# Patient Record
Sex: Female | Born: 1980 | Race: Black or African American | Hispanic: No | Marital: Single | Smoking: Former smoker
Health system: Southern US, Community
[De-identification: ages and names within clinical notes are randomized; demographics above are authoritative.]

## PROBLEM LIST (undated history)

## (undated) ENCOUNTER — Ambulatory Visit (HOSPITAL_COMMUNITY): Admission: EM | Payer: 59 | Source: Home / Self Care

## (undated) ENCOUNTER — Ambulatory Visit: Admission: EM | Payer: 59

## (undated) DIAGNOSIS — K219 Gastro-esophageal reflux disease without esophagitis: Secondary | ICD-10-CM

## (undated) DIAGNOSIS — Z5189 Encounter for other specified aftercare: Secondary | ICD-10-CM

## (undated) DIAGNOSIS — E559 Vitamin D deficiency, unspecified: Secondary | ICD-10-CM

## (undated) DIAGNOSIS — D649 Anemia, unspecified: Secondary | ICD-10-CM

## (undated) DIAGNOSIS — R51 Headache: Secondary | ICD-10-CM

## (undated) DIAGNOSIS — F329 Major depressive disorder, single episode, unspecified: Secondary | ICD-10-CM

## (undated) DIAGNOSIS — F419 Anxiety disorder, unspecified: Secondary | ICD-10-CM

## (undated) DIAGNOSIS — T7840XA Allergy, unspecified, initial encounter: Secondary | ICD-10-CM

## (undated) DIAGNOSIS — J45909 Unspecified asthma, uncomplicated: Secondary | ICD-10-CM

## (undated) DIAGNOSIS — F32A Depression, unspecified: Secondary | ICD-10-CM

## (undated) DIAGNOSIS — Z9889 Other specified postprocedural states: Secondary | ICD-10-CM

## (undated) DIAGNOSIS — D259 Leiomyoma of uterus, unspecified: Secondary | ICD-10-CM

## (undated) HISTORY — DX: Other specified postprocedural states: Z98.890

## (undated) HISTORY — DX: Encounter for other specified aftercare: Z51.89

## (undated) HISTORY — DX: Vitamin D deficiency, unspecified: E55.9

## (undated) HISTORY — DX: Major depressive disorder, single episode, unspecified: F32.9

## (undated) HISTORY — DX: Allergy, unspecified, initial encounter: T78.40XA

## (undated) HISTORY — DX: Headache: R51

## (undated) HISTORY — DX: Unspecified asthma, uncomplicated: J45.909

## (undated) HISTORY — DX: Depression, unspecified: F32.A

## (undated) HISTORY — DX: Gastro-esophageal reflux disease without esophagitis: K21.9

## (undated) HISTORY — DX: Anxiety disorder, unspecified: F41.9

## (undated) HISTORY — PX: WISDOM TOOTH EXTRACTION: SHX21

---

## 1999-08-11 ENCOUNTER — Encounter: Payer: Self-pay | Admitting: Emergency Medicine

## 1999-08-11 ENCOUNTER — Emergency Department (HOSPITAL_COMMUNITY): Admission: EM | Admit: 1999-08-11 | Discharge: 1999-08-11 | Payer: Self-pay | Admitting: *Deleted

## 1999-08-26 ENCOUNTER — Other Ambulatory Visit: Admission: RE | Admit: 1999-08-26 | Discharge: 1999-08-26 | Payer: Self-pay | Admitting: Obstetrics and Gynecology

## 1999-10-10 ENCOUNTER — Other Ambulatory Visit: Admission: RE | Admit: 1999-10-10 | Discharge: 1999-10-10 | Payer: Self-pay | Admitting: Obstetrics and Gynecology

## 1999-10-10 ENCOUNTER — Encounter (INDEPENDENT_AMBULATORY_CARE_PROVIDER_SITE_OTHER): Payer: Self-pay

## 2001-12-11 ENCOUNTER — Emergency Department (HOSPITAL_COMMUNITY): Admission: EM | Admit: 2001-12-11 | Discharge: 2001-12-12 | Payer: Self-pay | Admitting: Emergency Medicine

## 2007-05-11 ENCOUNTER — Emergency Department (HOSPITAL_COMMUNITY): Admission: EM | Admit: 2007-05-11 | Discharge: 2007-05-11 | Payer: Self-pay | Admitting: Emergency Medicine

## 2009-11-18 ENCOUNTER — Ambulatory Visit: Payer: Self-pay | Admitting: Internal Medicine

## 2009-11-18 ENCOUNTER — Encounter (INDEPENDENT_AMBULATORY_CARE_PROVIDER_SITE_OTHER): Payer: Self-pay | Admitting: Family Medicine

## 2009-11-18 LAB — CONVERTED CEMR LAB
ALT: 9 units/L (ref 0–35)
AST: 18 units/L (ref 0–37)
Albumin: 4.4 g/dL (ref 3.5–5.2)
Basophils Absolute: 0 10*3/uL (ref 0.0–0.1)
CO2: 20 meq/L (ref 19–32)
Cholesterol: 136 mg/dL (ref 0–200)
Eosinophils Relative: 6 % — ABNORMAL HIGH (ref 0–5)
Glucose, Bld: 87 mg/dL (ref 70–99)
LDL Cholesterol: 66 mg/dL (ref 0–99)
Lymphocytes Relative: 19 % (ref 12–46)
Lymphs Abs: 2.2 10*3/uL (ref 0.7–4.0)
MCV: 86 fL (ref 78.0–100.0)
Neutro Abs: 7.9 10*3/uL — ABNORMAL HIGH (ref 1.7–7.7)
Neutrophils Relative %: 72 % (ref 43–77)
Platelets: 335 10*3/uL (ref 150–400)
Potassium: 4.3 meq/L (ref 3.5–5.3)
Prolactin: 7.7 ng/mL
RBC: 4.87 M/uL (ref 3.87–5.11)
RDW: 15.8 % — ABNORMAL HIGH (ref 11.5–15.5)
Sodium: 136 meq/L (ref 135–145)
Testosterone: 58.02 ng/dL (ref 10–70)
Total Bilirubin: 0.4 mg/dL (ref 0.3–1.2)
Total CHOL/HDL Ratio: 2.4
Total Protein: 7.7 g/dL (ref 6.0–8.3)
VLDL: 14 mg/dL (ref 0–40)
WBC: 11.1 10*3/uL — ABNORMAL HIGH (ref 4.0–10.5)

## 2009-11-22 ENCOUNTER — Ambulatory Visit (HOSPITAL_COMMUNITY): Admission: RE | Admit: 2009-11-22 | Discharge: 2009-11-22 | Payer: Self-pay | Admitting: Family Medicine

## 2009-11-27 ENCOUNTER — Ambulatory Visit (HOSPITAL_COMMUNITY): Admission: RE | Admit: 2009-11-27 | Discharge: 2009-11-27 | Payer: Self-pay | Admitting: Family Medicine

## 2011-06-18 ENCOUNTER — Ambulatory Visit: Payer: Self-pay

## 2012-01-13 ENCOUNTER — Ambulatory Visit (INDEPENDENT_AMBULATORY_CARE_PROVIDER_SITE_OTHER): Payer: 59 | Admitting: Emergency Medicine

## 2012-01-13 VITALS — BP 118/74 | HR 71 | Temp 97.5°F | Resp 18 | Ht 66.0 in | Wt 168.0 lb

## 2012-01-13 DIAGNOSIS — J329 Chronic sinusitis, unspecified: Secondary | ICD-10-CM

## 2012-01-13 DIAGNOSIS — J45901 Unspecified asthma with (acute) exacerbation: Secondary | ICD-10-CM

## 2012-01-13 MED ORDER — AMOXICILLIN-POT CLAVULANATE 875-125 MG PO TABS: 1.0000 | ORAL_TABLET | Freq: Two times a day (BID) | ORAL | Status: DC

## 2012-01-13 MED ORDER — PSEUDOEPHEDRINE-GUAIFENESIN ER 60-600 MG PO TB12: 1.0000 | ORAL_TABLET | Freq: Two times a day (BID) | ORAL | Status: DC

## 2012-01-13 MED ORDER — ALBUTEROL SULFATE HFA 108 (90 BASE) MCG/ACT IN AERS: 2.0000 | INHALATION_SPRAY | RESPIRATORY_TRACT | Status: DC | PRN

## 2012-01-13 MED ORDER — MOMETASONE FUROATE 50 MCG/ACT NA SUSP: 2.0000 | Freq: Every day | NASAL | Status: DC

## 2012-01-13 NOTE — Progress Notes (Signed)
[facility] [address], [place] [address] [phone]  Date:  01/13/2012   Name:  Alicia Wells   DOB:  [birth year]   MRN:  [id number]  PCP:  No primary provider on file.    Chief Complaint: Sinusitis and Asthma   History of Present Illness:  Alicia Wells is a 31 y.o. very pleasant female patient who presents with the following:  Marked nasal congestion for past three weeks with no nasal discharge has some post nasal drip but not seen.  Has headache and pressure in frontal and maxillary sinuses.  No fever or chills.  No sore throat.  Cough that is largely non productive and worse at night. No improvement with OTC medications.  Some exacerbation of asthma and using her aunt's MDI with some improvement.  There is no problem list on file for this patient.   Past Medical History  Diagnosis Date  . Allergy   . Blood transfusion without reported diagnosis   . Depression   . Anxiety     No past surgical history on file.  History  Substance Use Topics  . Smoking status: Current Every Day Smoker -- 1.0 packs/day    Types: Cigarettes    Start date: 01/12/2002  . Smokeless tobacco: Not on file  . Alcohol Use: Not on file    No family history on file.  Allergies  Allergen Reactions  . Penicillins     Medication list has been reviewed and updated.  No current outpatient prescriptions on file prior to visit.    Review of Systems:  As per HPI, otherwise negative.    Physical Examination: Filed Vitals:   01/13/12 1207  BP: 118/74  Pulse: 71  Temp: 97.5 F (36.4 C)  Resp: 18   Filed Vitals:   01/13/12 1207  Height: 5\' 6"  (1.676 m)  Weight: 168 lb (76.204 kg)   Body mass index is 27.12 kg/(m^2). Ideal Body Weight: Weight in (lb) to have BMI = 25: 154.6   GEN: WDWN, NAD, Non-toxic, A & O x 3  No rash or sepsis. No shortness of breath HEENT: Atraumatic, Normocephalic. Neck supple. No masses, No LAD.  Oropharynx  negative Ears and Nose: No external deformity. TM negative.  Nasal mucosa erythematous with posterior pharyngeal wall purulent drainage CV: RRR, No M/G/R. No JVD. No thrill. No extra heart sounds. PULM: CTA B, no wheezes, crackles, rhonchi. No retractions. No resp. distress. No accessory muscle use. ABD: S, NT, ND, +BS. No rebound. No HSM. EXTR: No c/c/e NEURO Normal gait.  PSYCH: Normally interactive. Conversant. Not depressed or anxious appearing.  Calm demeanor.    Assessment and Plan: Sinusitis Exacerbation asthma Augment Albuterol mucinex d nasonex  Carmelina Dane, MD  I have reviewed and agree with documentation. Robert P. Merla Riches, M.D.

## 2012-01-15 ENCOUNTER — Telehealth: Payer: Self-pay

## 2012-01-15 MED ORDER — CLARITHROMYCIN ER 500 MG PO TB24: 1000.0000 mg | ORAL_TABLET | Freq: Every day | ORAL | Status: DC

## 2012-01-15 NOTE — Telephone Encounter (Signed)
D/C the Augmentin.  Biaxin XL 500 mg 2 PO QD x 10 days, #20, no refills sent to the pharmacy.  Please advise her to take it will food-a meal, not just a snack-as it can cause GI upset as well.  Also, she can expect a metallic taste as a side effect of this product.  (Of, note, the patient lists Penicillins as an allergy, and in the encounter note, though no reaction is documented.)

## 2012-01-15 NOTE — Telephone Encounter (Signed)
Being treated for sinusitis, states Amox makes her nauseated, please advise if we can change.

## 2012-01-15 NOTE — Telephone Encounter (Signed)
Amoxicillin is making patient nauseated and she cannot keep food down, would like different rx or something for nausea.  Best [phone]

## 2012-01-15 NOTE — Telephone Encounter (Signed)
patient notified and voiced understanding. Called pharmacy and left RX on voice mail.

## 2012-06-29 ENCOUNTER — Ambulatory Visit: Payer: 59

## 2012-07-29 ENCOUNTER — Encounter: Payer: Self-pay | Admitting: Family Medicine

## 2012-07-29 ENCOUNTER — Ambulatory Visit (INDEPENDENT_AMBULATORY_CARE_PROVIDER_SITE_OTHER): Payer: 59 | Admitting: Family Medicine

## 2012-07-29 VITALS — BP 110/70 | HR 72 | Temp 99.0°F | Resp 12 | Ht 65.5 in | Wt 162.0 lb

## 2012-07-29 DIAGNOSIS — Z8659 Personal history of other mental and behavioral disorders: Secondary | ICD-10-CM

## 2012-07-29 DIAGNOSIS — J45909 Unspecified asthma, uncomplicated: Secondary | ICD-10-CM

## 2012-07-29 DIAGNOSIS — J029 Acute pharyngitis, unspecified: Secondary | ICD-10-CM

## 2012-07-29 DIAGNOSIS — G43909 Migraine, unspecified, not intractable, without status migrainosus: Secondary | ICD-10-CM

## 2012-07-29 DIAGNOSIS — F17200 Nicotine dependence, unspecified, uncomplicated: Secondary | ICD-10-CM

## 2012-07-29 DIAGNOSIS — J4520 Mild intermittent asthma, uncomplicated: Secondary | ICD-10-CM

## 2012-07-29 NOTE — Progress Notes (Signed)
  Subjective:    Patient ID: Alicia Wells, female    DOB: 12/09/1980, 32 y.o.   MRN: [id number]  HPI Patient seen to establish care. History of reported mild intermittent asthma. No flareups in several years. Currently takes no regular medications.  She is here with reported one month history of sore throat left side of throat. No known foreign bodies. No lymphadenopathy. No fever. No appetite or weight changes. Denies any GERD symptoms.  Smoker. Symptoms are relatively mild. Occasional improvement with ibuprofen. No exacerbating features.  Reported history of migraine headaches though none in quite some time. Also past history of depression which is been stable off medication for 2 years. No prior surgeries. No siblings. Parents are alive and in good health. Father may have type 2 diabetes  Patient is single. Works at [employer].  Past Medical History  Diagnosis Date  . Allergy   . Blood transfusion without reported diagnosis   . Depression   . Anxiety   . Headache    History reviewed. No pertinent past surgical history.  reports that she has been smoking Cigarettes.  She started smoking about 10 years ago. She has been smoking about 1.00 pack per day. She does not have any smokeless tobacco history on file. She reports that she does not use illicit drugs. Her alcohol history is not on file. family history includes Diabetes in her father. Allergies  Allergen Reactions  . Penicillins         Review of Systems  Constitutional: Negative for fever, chills, appetite change, fatigue and unexpected weight change.  HENT: Positive for sore throat. Negative for ear pain, congestion, trouble swallowing and voice change.   Respiratory: Negative for cough, shortness of breath and wheezing.   Cardiovascular: Negative for chest pain.  Neurological: Negative for headaches.  Hematological: Negative for adenopathy.       Objective:   Physical Exam  Constitutional: She is  oriented to person, place, and time. She appears well-developed and well-nourished.  HENT:  Right Ear: External ear normal.  Left Ear: External ear normal.  Mouth/Throat: Oropharynx is clear and moist.  Neck: Neck supple. No thyromegaly present.  Cardiovascular: Normal rate and regular rhythm.   No murmur heard. Pulmonary/Chest: Effort normal and breath sounds normal. No respiratory distress. She has no wheezes. She has no rales.  Musculoskeletal: She exhibits no edema.  Lymphadenopathy:    She has no cervical adenopathy.  Neurological: She is alert and oriented to person, place, and time.  Psychiatric: She has a normal mood and affect. Her behavior is normal.          Assessment & Plan:  #1 sore throat. Nonfocal exam. No evidence for a tonsillar masses or oropharyngeal masses. No lymphadenopathy. No exudate or erythema. We discussed possible referral to ENT for further evaluation if symptoms persist. She will try some saltwater gargles first and be in touch one week if no improvement #2 history of migraine headaches. Stable #3 ongoing nicotine use. Current motivation fairly low #4 past history depression currently stable

## 2012-07-29 NOTE — Patient Instructions (Addendum)
Try some salt water gargles. Let me know if throat no better in 2-3 weeks. Consider complete physical at some point later this year.

## 2012-10-31 DIAGNOSIS — Z0271 Encounter for disability determination: Secondary | ICD-10-CM

## 2012-11-28 ENCOUNTER — Encounter: Payer: 59 | Admitting: Family Medicine

## 2012-12-19 ENCOUNTER — Ambulatory Visit (INDEPENDENT_AMBULATORY_CARE_PROVIDER_SITE_OTHER): Payer: 59 | Admitting: Family Medicine

## 2012-12-19 ENCOUNTER — Encounter: Payer: Self-pay | Admitting: Family Medicine

## 2012-12-19 ENCOUNTER — Other Ambulatory Visit (HOSPITAL_COMMUNITY)
Admission: RE | Admit: 2012-12-19 | Discharge: 2012-12-19 | Disposition: A | Discharge status: Home / Self Care | Payer: 59 | Source: Ambulatory Visit | Attending: Family Medicine | Admitting: Family Medicine

## 2012-12-19 VITALS — BP 114/72 | HR 89 | Temp 98.1°F | Wt 164.0 lb

## 2012-12-19 DIAGNOSIS — F17200 Nicotine dependence, unspecified, uncomplicated: Secondary | ICD-10-CM

## 2012-12-19 DIAGNOSIS — Z0000 Encounter for general adult medical examination without abnormal findings: Secondary | ICD-10-CM

## 2012-12-19 DIAGNOSIS — Z01419 Encounter for gynecological examination (general) (routine) without abnormal findings: Secondary | ICD-10-CM | POA: Insufficient documentation

## 2012-12-19 LAB — CBC WITH DIFFERENTIAL/PLATELET
Basophils Relative: 0.4 % (ref 0.0–3.0)
Eosinophils Relative: 5.2 % — ABNORMAL HIGH (ref 0.0–5.0)
HCT: 37.5 % (ref 36.0–46.0)
Lymphs Abs: 2.9 10*3/uL (ref 0.7–4.0)
MCV: 85.4 fl (ref 78.0–100.0)
Monocytes Absolute: 0.7 10*3/uL (ref 0.1–1.0)
Neutro Abs: 7.7 10*3/uL (ref 1.4–7.7)
RBC: 4.39 Mil/uL (ref 3.87–5.11)
WBC: 11.9 10*3/uL — ABNORMAL HIGH (ref 4.5–10.5)

## 2012-12-19 LAB — LIPID PANEL: Cholesterol: 133 mg/dL (ref 0–200)

## 2012-12-19 LAB — BASIC METABOLIC PANEL
BUN: 9 mg/dL (ref 6–23)
Calcium: 9.8 mg/dL (ref 8.4–10.5)
GFR: 98.42 mL/min (ref >60.00)
Glucose, Bld: 92 mg/dL (ref 70–99)
Sodium: 139 mEq/L (ref 135–145)

## 2012-12-19 LAB — POCT URINALYSIS DIPSTICK
Ketones, UA: NEGATIVE
Leukocytes, UA: NEGATIVE
Nitrite, UA: NEGATIVE
Protein, UA: NEGATIVE
pH, UA: 7

## 2012-12-19 LAB — HEPATIC FUNCTION PANEL
ALT: 19 U/L (ref 0–35)
AST: 28 U/L (ref 0–37)
Albumin: 3.9 g/dL (ref 3.5–5.2)
Total Protein: 7.6 g/dL (ref 6.0–8.3)

## 2012-12-19 MED ORDER — BECLOMETHASONE DIPROPIONATE 80 MCG/ACT IN AERS: 2.0000 | INHALATION_SPRAY | Freq: Two times a day (BID) | RESPIRATORY_TRACT | Status: DC

## 2012-12-19 NOTE — Progress Notes (Signed)
  Subjective:    Patient ID: Alicia Wells, female    DOB: 02-08-1981, 32 y.o.   MRN: [id number]  HPI Patient seen for complete physical. She has ongoing nicotine use and currently only smokes about 6 cigarettes per day. She is hoping to quit soon on her own. She has history of reported mild intermittent asthma. However, recently she has been wheezing almost nightly. She uses albuterol as needed. She does not take any controller medications. She has not had a Pap smear estimated in 2 years. She denies prior history of abnormal Pap smear. Currently not sexually active. No history of HPV  She had tetanus 2012. Declines flu vaccine.  Past Medical History  Diagnosis Date  . Allergy   . Blood transfusion without reported diagnosis   . Depression   . Anxiety   . Headache(784.0)    No past surgical history on file.  reports that she has been smoking Cigarettes.  She started smoking about 10 years ago. She has been smoking about 1.00 pack per day. She does not have any smokeless tobacco history on file. She reports that she does not use illicit drugs. Her alcohol history is not on file. family history includes Diabetes in her father. Allergies  Allergen Reactions  . Penicillins       Review of Systems  Constitutional: Negative for fever, activity change, appetite change, fatigue and unexpected weight change.  HENT: Negative for hearing loss, ear pain, sore throat and trouble swallowing.   Eyes: Negative for visual disturbance.  Respiratory: Negative for cough and shortness of breath.   Cardiovascular: Negative for chest pain and palpitations.  Gastrointestinal: Negative for abdominal pain, diarrhea, constipation and blood in stool.  Genitourinary: Negative for dysuria and hematuria.  Musculoskeletal: Negative for myalgias, back pain and arthralgias.  Skin: Negative for rash.  Neurological: Negative for dizziness, syncope and headaches.  Hematological: Negative for adenopathy.   Psychiatric/Behavioral: Negative for confusion and dysphoric mood.       Objective:   Physical Exam  Constitutional: She is oriented to person, place, and time. She appears well-developed and well-nourished.  HENT:  Head: Normocephalic and atraumatic.  Eyes: EOM are normal. Pupils are equal, round, and reactive to light.  Neck: Normal range of motion. Neck supple. No thyromegaly present.  Cardiovascular: Normal rate, regular rhythm and normal heart sounds.   No murmur heard. Pulmonary/Chest: Breath sounds normal. No respiratory distress. She has no wheezes. She has no rales.  Abdominal: Soft. Bowel sounds are normal. She exhibits no distension and no mass. There is no tenderness. There is no rebound and no guarding.  Genitourinary:  Breasts are symmetric with no mass. Pelvic exam normal external genitalia. Vaginal mucosa normal in appearance. Pap smear obtained. Uterus normal size. No adnexal masses.  Musculoskeletal: Normal range of motion. She exhibits no edema.  Lymphadenopathy:    She has no cervical adenopathy.  Neurological: She is alert and oriented to person, place, and time. She displays normal reflexes. No cranial nerve deficit.  Skin: No rash noted.  Psychiatric: She has a normal mood and affect. Her behavior is normal. Judgment and thought content normal.          Assessment & Plan:  Complete physical. Screening lab work obtained. Pap smear obtained. Add steroid inhaler to help control asthma and reassess one month. Smoking cessation discussed. Her motivation is fair. Flu vaccine recommended and declined

## 2012-12-19 NOTE — Patient Instructions (Addendum)
Smoking Cessation Quitting smoking is important to your health and has many advantages. However, it is not always easy to quit since nicotine is a very addictive drug. Often times, people try 3 times or more before being able to quit. This document explains the best ways for you to prepare to quit smoking. Quitting takes hard work and a lot of effort, but you can do it. ADVANTAGES OF QUITTING SMOKING  You will live longer, feel better, and live better.  Your body will feel the impact of quitting smoking almost immediately.  Within 20 minutes, blood pressure decreases. Your pulse returns to its normal level.  After 8 hours, carbon monoxide levels in the blood return to normal. Your oxygen level increases.  After 24 hours, the chance of having a heart attack starts to decrease. Your breath, hair, and body stop smelling like smoke.  After 48 hours, damaged nerve endings begin to recover. Your sense of taste and smell improve.  After 72 hours, the body is virtually free of nicotine. Your bronchial tubes relax and breathing becomes easier.  After 2 to 12 weeks, lungs can hold more air. Exercise becomes easier and circulation improves.  The risk of having a heart attack, stroke, cancer, or lung disease is greatly reduced.  After 1 year, the risk of coronary heart disease is cut in half.  After 5 years, the risk of stroke falls to the same as a nonsmoker.  After 10 years, the risk of lung cancer is cut in half and the risk of other cancers decreases significantly.  After 15 years, the risk of coronary heart disease drops, usually to the level of a nonsmoker.  If you are pregnant, quitting smoking will improve your chances of having a healthy baby.  The people you live with, especially any children, will be healthier.  You will have extra money to spend on things other than cigarettes. QUESTIONS TO THINK ABOUT BEFORE ATTEMPTING TO QUIT You may want to talk about your answers with your  caregiver.  Why do you want to quit?  If you tried to quit in the past, what helped and what did not?  What will be the most difficult situations for you after you quit? How will you plan to handle them?  Who can help you through the tough times? Your family? Friends? A caregiver?  What pleasures do you get from smoking? What ways can you still get pleasure if you quit? Here are some questions to ask your caregiver:  How can you help me to be successful at quitting?  What medicine do you think would be best for me and how should I take it?  What should I do if I need more help?  What is smoking withdrawal like? How can I get information on withdrawal? GET READY  Set a quit date.  Change your environment by getting rid of all cigarettes, ashtrays, matches, and lighters in your home, car, or work. Do not let people smoke in your home.  Review your past attempts to quit. Think about what worked and what did not. GET SUPPORT AND ENCOURAGEMENT You have a better chance of being successful if you have help. You can get support in many ways.  Tell your family, friends, and co-workers that you are going to quit and need their support. Ask them not to smoke around you.  Get individual, group, or telephone counseling and support. Programs are available at local hospitals and health centers. Call your local health department for   information about programs in your area.  Spiritual beliefs and practices may help some smokers quit.  Download a "quit meter" on your computer to keep track of quit statistics, such as how long you have gone without smoking, cigarettes not smoked, and money saved.  Get a self-help book about quitting smoking and staying off of tobacco. LEARN NEW SKILLS AND BEHAVIORS  Distract yourself from urges to smoke. Talk to someone, go for a walk, or occupy your time with a task.  Change your normal routine. Take a different route to work. Drink tea instead of coffee.  Eat breakfast in a different place.  Reduce your stress. Take a hot bath, exercise, or read a book.  Plan something enjoyable to do every day. Reward yourself for not smoking.  Explore interactive web-based programs that specialize in helping you quit. GET MEDICINE AND USE IT CORRECTLY Medicines can help you stop smoking and decrease the urge to smoke. Combining medicine with the above behavioral methods and support can greatly increase your chances of successfully quitting smoking.  Nicotine replacement therapy helps deliver nicotine to your body without the negative effects and risks of smoking. Nicotine replacement therapy includes nicotine gum, lozenges, inhalers, nasal sprays, and skin patches. Some may be available over-the-counter and others require a prescription.  Antidepressant medicine helps people abstain from smoking, but how this works is unknown. This medicine is available by prescription.  Nicotinic receptor partial agonist medicine simulates the effect of nicotine in your brain. This medicine is available by prescription. Ask your caregiver for advice about which medicines to use and how to use them based on your health history. Your caregiver will tell you what side effects to look out for if you choose to be on a medicine or therapy. Carefully read the information on the package. Do not use any other product containing nicotine while using a nicotine replacement product.  RELAPSE OR DIFFICULT SITUATIONS Most relapses occur within the first 3 months after quitting. Do not be discouraged if you start smoking again. Remember, most people try several times before finally quitting. You may have symptoms of withdrawal because your body is used to nicotine. You may crave cigarettes, be irritable, feel very hungry, cough often, get headaches, or have difficulty concentrating. The withdrawal symptoms are only temporary. They are strongest when you first quit, but they will go away within  10 14 days. To reduce the chances of relapse, try to:  Avoid drinking alcohol. Drinking lowers your chances of successfully quitting.  Reduce the amount of caffeine you consume. Once you quit smoking, the amount of caffeine in your body increases and can give you symptoms, such as a rapid heartbeat, sweating, and anxiety.  Avoid smokers because they can make you want to smoke.  Do not let weight gain distract you. Many smokers will gain weight when they quit, usually less than 10 pounds. Eat a healthy diet and stay active. You can always lose the weight gained after you quit.  Find ways to improve your mood other than smoking. FOR MORE INFORMATION  www.smokefree.gov  Document Released: 03/03/2001 Document Revised: 09/08/2011 Document Reviewed: 06/18/2011 [employer] Patient Information 2014 [employer].  

## 2012-12-22 ENCOUNTER — Telehealth: Payer: Self-pay | Admitting: Family Medicine

## 2012-12-22 NOTE — Telephone Encounter (Signed)
Pt informed

## 2012-12-22 NOTE — Telephone Encounter (Signed)
Patient aware of her results. Patient stated the Qvar is not helping her breathing and she actually to take her Proair afterwards

## 2012-12-22 NOTE — Telephone Encounter (Signed)
Has not had time yet for Qvar.  Please emphasize to her that Qvar takes some time to become effective.

## 2012-12-22 NOTE — Telephone Encounter (Signed)
Pt returned your call for results of labs. pls call

## 2013-01-23 ENCOUNTER — Encounter: Payer: Self-pay | Admitting: Family Medicine

## 2013-01-23 ENCOUNTER — Ambulatory Visit (INDEPENDENT_AMBULATORY_CARE_PROVIDER_SITE_OTHER): Payer: 59 | Admitting: Family Medicine

## 2013-01-23 VITALS — BP 110/68 | HR 82 | Temp 98.3°F | Wt 164.0 lb

## 2013-01-23 DIAGNOSIS — J4540 Moderate persistent asthma, uncomplicated: Secondary | ICD-10-CM

## 2013-01-23 DIAGNOSIS — J45909 Unspecified asthma, uncomplicated: Secondary | ICD-10-CM

## 2013-01-23 DIAGNOSIS — J4520 Mild intermittent asthma, uncomplicated: Secondary | ICD-10-CM | POA: Insufficient documentation

## 2013-01-23 MED ORDER — VARENICLINE TARTRATE 0.5 MG X 11 & 1 MG X 42 PO MISC: ORAL | Status: DC

## 2013-01-23 MED ORDER — VARENICLINE TARTRATE 1 MG PO TABS: 1.0000 mg | ORAL_TABLET | Freq: Two times a day (BID) | ORAL | Status: DC

## 2013-01-23 NOTE — Patient Instructions (Signed)
Asthma, Adult Asthma is a condition that affects your lungs. It is characterized by swelling and narrowing of your airways as well as increased mucus production. The narrowing comes from swelling and muscle spasms inside the airways. When this happens, breathing can be difficult and you can have coughing, wheezing, and shortness of breath. Knowing more about asthma can help you manage it better. Asthma cannot be cured, but medicines and lifestyle changes can help control it. Asthma can be a minor problem for some people but if it is not controlled it can lead to a life-threatening asthma attack. Asthma can change over time. It is important to work with your caregiver to manage your asthma symptoms. CAUSES The exact cause of asthma is unknown. Asthma is believed to be caused by inherited (genetic) and environmental exposures. Swelling and redness (inflammation) of the airways occurs in asthma. This can be triggered by allergies, viral lung infections, or irritants in the air. Allergic reactions can cause you to wheeze immediately or several hours after an exposure. Asthma triggers are different for each person. It is important to pay attention and know what triggers your asthma.  Common triggers for asthma attacks include:  Animal dander from the skin, hair, or feathers of animals.  Dust mites contained in house dust.  Cockroaches.  Pollen from trees or grass.  Mold.  Cigarette or tobacco smoke. Smoking cannot be allowed in homes of people with asthma. People with asthma should not smoke and should not be around smokers.  Air pollutants such as dust, household cleaners, hair sprays, aerosol sprays, paint fumes, strong chemicals, or strong odors.  Cold air or weather changes. Cold air may cause inflammation. Winds increase molds and pollens in the air. There is not one best climate for people with asthma.  Strong emotions such as crying or laughing hard.  Stress.  Certain medicines such as  aspirin or beta-blockers.  Sulfites in such foods and drinks as dried fruits and wine.  Infections or inflammatory conditions such as the flu, a cold, or an inflammation of the nasal membranes (rhinitis).  Gastroesophageal reflux disease (GERD). GERD is a condition where stomach acid backs up into your throat (esophagus).  Exercise or strenous activity. Proper pre-exercise medicines allow most people to participate in sports. SYMPTOMS  Feeling short of breath.  Chest tightness or pain.  Difficulty sleeping due to coughing, wheezing, or feeling short of breath.  A whistling or wheezing sound with exhalation.  Coughing or wheezing that is worse when you:  Have a virus (such as a cold or the flu).  Are suffering from allergies.  Are exposed to certain fumes or chemicals.  Exercise. Signs that your asthma is probably getting worse include:   More frequent and bothersome asthma signs and symptoms.  Increasing difficulty breathing. This can be measured by a peak flow meter, which is a simple device used to check how well your lungs are working.  An increasingly frequent need to use a quick-relief inhaler. DIAGNOSIS  The diagnosis of asthma is made by review of your medical history, a physical exam, and possibly from other tests. Lung function studies may help with the diagnosis. TREATMENT  Asthma cannot be cured. However, for the majority of adults, asthma can be controlled with treatment. Besides avoidance of triggers of your asthma, medicines are often required. There are 2 classes of medicine used for asthma treatment: controller medicines (reduce inflammation and symptoms) andreliever or rescue medicines (relieve asthma symptoms during acute attacks). You may require daily   medicines to control your asthma. The most effective long-term controller medicines for asthma are inhaled corticosteroids (blocks inflammation). Other long-term control medicines include:  Leukotriene  receptor antagonists (blocks a pathway of inflammation).  Long-acting beta2-agonists (relaxes the muscles of the airways for at least 12 hours) with an inhaled corticosteroid.  Cromolyn sodium or nedocromil (alters certain inflammatory cells' ability to release chemicals that cause inflammation).  Immunomodulators (alters the immune system to prevent asthma symptoms).  Theophylline (relaxes muscles in the airways). You may also require a short-acting beta2-agonist to relieve asthma symptoms during an acute attack. You should understand what to do during an acute attack. Inhaled medicines are effective when used properly. Read the instructions on how to use your medicines correctly and speak to your caregiver if you have questions. Follow up with your caregiver on a regular basis to make sure your asthma is well-controlled. If your asthma is not well-controlled, if you have been hospitalized for asthma, or if multiple medicines or medium to high doses of inhaled corticosteroids are needed to control your asthma, request a referral to an asthma specialist. HOME CARE INSTRUCTIONS   Take medicines as directed by your caregiver.  Control your home environment in the following ways to help prevent asthma attacks:  Change your heating and air conditioning filter at least once a month.  Place a filter or cheesecloth over your heating and air conditioning vents.  Limit the use of fireplaces and wood stoves.  Do not smoke. Do not stay in places where others are smoking.  Get rid of pests (such as roaches and mice) and their droppings.  If you see mold on a plant, throw it away.  Clean your floors and dust every week. Use unscented cleaning products. Use a vacuum cleaner with a HEPA filter if possible. If vacuuming or cleaning triggers your asthma, try to find someone else to do these chores.  Floors in your house should be wood, tile, or vinyl. Carpet can trap dander and dust.  Use  allergy-proof pillows, mattress covers, and box spring covers.  Wash bedsheets and blankets every week in hot water and dry in a dryer.  Use a blanket that is made of polyester or cotton with a tight nap.  Do not use a dust ruffle on your bed.  Clean bathrooms and kitchens with bleach and repaint with mold-resistant paint.  Wash hands frequently.  Talk to your caregiver about an action plan for managing asthma attacks. This includes the use of a peak flow meter which measures the severity of the attack and medicines that can help stop the attack. An action plan can help minimize or stop the attack without having to seek medical care.  Remain calm during an asthma attack.  Always have a plan prepared for seeking medical attention. This should include contacting your caregiver and in the case of a severe attack, calling your local emergency services (911 in [place] SEEK MEDICAL CARE IF:   You have wheezing, shortness of breath, or a cough even if taking medicine to prevent attacks.  You have thickening of sputum.  Your sputum changes from clear or white to yellow, green, gray, or bloody.  You have any problems that may be related to the medicines you are taking (such as a rash, itching, swelling, or trouble breathing).  You are using a reliever medicine more than 2 3 times per week.  Your peak flow is still at 50 79% of personal best after following your action plan for 1   hour. SEEK IMMEDIATE MEDICAL CARE IF:   You are short of breath even at rest.  You get short of breath when doing very little physical activity.  You have difficulty eating, drinking, or talking due to asthma symptoms.  You have chest pain or you feel that your heart is beating fast.  You have a bluish color to your lips or fingernails.  You are lightheaded, dizzy, or faint.  You have a fever or persistent symptoms for more than 2 3 days.  You have a fever and symptoms suddenly get worse.  You seem to be  getting worse and are unresponsive to treatment during an asthma attack.  Your peak flow is less than 50% of personal best. MAKE SURE YOU:   Understand these instructions.  Will watch your condition.  Will get help right away if you are not doing well or get worse. Document Released: 03/09/2005 Document Revised: 02/24/2012 Document Reviewed: 10/26/2007 [facility] Patient Information 2014 [place], [place].  Take your steroid inhaler twice DAILY and remember to rinse mouth after use.

## 2013-01-23 NOTE — Progress Notes (Signed)
  Subjective:    Patient ID: Alicia Wells, female    DOB: [birth year], 32 y.o.   MRN: [id number]  HPI Followup regarding asthma. Unfortunately, she has not been taking steroid inhaler regularly. She is still smoking but is trying to quit She specifically requests trial of Chantix. She has not used this previously. Still has some coughing and wheezing intermittently at night-most days of the week. Still uses albuterol fairly often. No recent fevers or chills.  Past Medical History  Diagnosis Date  . Allergy   . Blood transfusion without reported diagnosis   . Depression   . Anxiety   . Headache(784.0)    No past surgical history on file.  reports that she has been smoking Cigarettes.  She started smoking about 11 years ago. She has been smoking about 1.00 pack per day. She does not have any smokeless tobacco history on file. She reports that she does not use illicit drugs. Her alcohol history is not on file. family history includes Diabetes in her father. Allergies  Allergen Reactions  . Penicillins       Review of Systems  Constitutional: Positive for fever. Negative for chills.  HENT: Negative for postnasal drip.   Respiratory: Positive for cough and wheezing.        Objective:   Physical Exam  Constitutional: She appears well-developed and well-nourished.  HENT:  Mouth/Throat: Oropharynx is clear and moist.  Neck: Neck supple. No thyromegaly present.  Cardiovascular: Normal rate and regular rhythm.   Pulmonary/Chest: Effort normal. She has wheezes. She has no rales.          Assessment & Plan:  Asthma, moderate persistent and poorly controlled. Smoking cessation discussed. Trial Chantix. Instructions given. We've recommended twice daily regular use of steroid inhaler with beclomethasone. Instructions given for use. Flu vaccine recommended but declined

## 2013-04-13 ENCOUNTER — Telehealth: Payer: Self-pay | Admitting: Family Medicine

## 2013-04-13 ENCOUNTER — Other Ambulatory Visit: Payer: Self-pay

## 2013-04-13 MED ORDER — ALBUTEROL SULFATE HFA 108 (90 BASE) MCG/ACT IN AERS: 2.0000 | INHALATION_SPRAY | RESPIRATORY_TRACT | Status: DC | PRN

## 2013-04-13 NOTE — Addendum Note (Signed)
Addended by: Marcina Millard on: 04/13/2013 04:48 PM   Modules accepted: Orders

## 2013-04-13 NOTE — Telephone Encounter (Signed)
Pt needs new rx proair HFA inhaler sent to [facility] on [address]

## 2013-04-13 NOTE — Telephone Encounter (Signed)
Error/gd °

## 2013-04-13 NOTE — Telephone Encounter (Signed)
RX sent to pharmacy  

## 2013-05-19 ENCOUNTER — Ambulatory Visit: Payer: 59 | Admitting: Family Medicine

## 2013-05-19 VITALS — BP 100/60 | HR 89 | Temp 97.9°F | Resp 16 | Ht 66.0 in | Wt 157.0 lb

## 2013-05-19 DIAGNOSIS — R197 Diarrhea, unspecified: Secondary | ICD-10-CM

## 2013-05-19 DIAGNOSIS — K529 Noninfective gastroenteritis and colitis, unspecified: Secondary | ICD-10-CM

## 2013-05-19 DIAGNOSIS — R112 Nausea with vomiting, unspecified: Secondary | ICD-10-CM

## 2013-05-19 DIAGNOSIS — K5289 Other specified noninfective gastroenteritis and colitis: Secondary | ICD-10-CM

## 2013-05-19 MED ORDER — ONDANSETRON 4 MG PO TBDP: ORAL_TABLET | ORAL | Status: DC

## 2013-05-19 NOTE — Patient Instructions (Signed)
Gradually advance diet with clear liquids and dry toast or crackers or broth tonight. Then tomorrow can gradually eat more as tolerated. Fried and fatty foods will upset her stomach more, as will dairy  If symptoms persist or are getting worse please return, or go to the emergency room if severely ill  If nausea and vomiting persists get the Zofran prescription filled and take one every 4-6 hours, dissolving in mouth, as needed.

## 2013-05-19 NOTE — Progress Notes (Signed)
Subjective: Pleasant young lady who was at work yesterday and developed vomiting and diarrhea. She vomited last sometime after midnight. She did take some antidiarrheal medication, and has not continued to have the diarrhea. She has mild discomfort in her left lower quadrant of her abdomen, but no major problems. She did feel chilled last night like she might have a fever, did not take her temperature. She is not on any regular medications. She denies any chance of pregnancy because she's not sexually involved. Last menstrual period was sometime in January. She is not vomiting today, but really hasn't had anything since Wednesday night. She denies any urinary symptoms.  Objective: TMs normal. Throat clear. Mucous membranes adequately moist. Neck supple without nodes. Chest clear to auscultation. Heart regular without murmurs. Abdomen soft without masses. Minimal tenderness over the sigmoid region. Clinically she is not dehydrated. She has continued to urinate.  Assessment: Gastroenteritis, viral  Plan: Treat symptomatically. No special testing is needed at this time.

## 2013-05-20 ENCOUNTER — Encounter: Payer: 59 | Admitting: Family Medicine

## 2013-05-20 NOTE — Progress Notes (Signed)
   Subjective:    Patient ID: Alicia Wells, female    DOB: September 06, 1980, 33 y.o.   MRN: [id number]  HPI    Review of Systems     Objective:   Physical Exam        Assessment & Plan:   This encounter was created in error - please disregard.

## 2013-11-23 ENCOUNTER — Encounter: Payer: Self-pay | Admitting: Family Medicine

## 2013-11-23 ENCOUNTER — Ambulatory Visit (INDEPENDENT_AMBULATORY_CARE_PROVIDER_SITE_OTHER): Payer: 59 | Admitting: Family Medicine

## 2013-11-23 VITALS — BP 110/66 | HR 97 | Temp 98.1°F | Wt 154.0 lb

## 2013-11-23 DIAGNOSIS — S239XXA Sprain of unspecified parts of thorax, initial encounter: Secondary | ICD-10-CM

## 2013-11-23 DIAGNOSIS — S29019A Strain of muscle and tendon of unspecified wall of thorax, initial encounter: Secondary | ICD-10-CM

## 2013-11-23 MED ORDER — ALBUTEROL SULFATE HFA 108 (90 BASE) MCG/ACT IN AERS: 2.0000 | INHALATION_SPRAY | RESPIRATORY_TRACT | Status: DC | PRN

## 2013-11-23 MED ORDER — METHOCARBAMOL 500 MG PO TABS: 500.0000 mg | ORAL_TABLET | Freq: Four times a day (QID) | ORAL | Status: DC | PRN

## 2013-11-23 MED ORDER — IBUPROFEN 800 MG PO TABS: 800.0000 mg | ORAL_TABLET | Freq: Three times a day (TID) | ORAL | Status: DC | PRN

## 2013-11-23 NOTE — Progress Notes (Signed)
   Subjective:    Patient ID: Alicia Wells, female    DOB: 07-05-1980, 33 y.o.   MRN: [id number]  Back Pain Pertinent negatives include no abdominal pain, chest pain or fever.   Acute visit. Midthoracic back pain. Onset 3 days ago when she woke up. She woke up in an odd position and thinks that may have triggered. No specific injury. Dull achy pain which is relatively continuous and mild to moderate severity. No radiation. No fevers or chills. No dysuria. Sore to move certain positions. She used ibuprofen 800 mg from someone else which seemed to help. She has some muscle spasm at times. No dyspnea. No cough.  Past Medical History  Diagnosis Date  . Allergy   . Blood transfusion without reported diagnosis   . Depression   . Anxiety   . Headache(784.0)    No past surgical history on file.  reports that she has been smoking Cigarettes.  She started smoking about 11 years ago. She has been smoking about 1.00 pack per day. She does not have any smokeless tobacco history on file. She reports that she does not use illicit drugs. Her alcohol history is not on file. family history includes Diabetes in her father. Allergies  Allergen Reactions  . Penicillins       Review of Systems  Constitutional: Negative for fever and chills.  Respiratory: Negative for shortness of breath and wheezing.   Cardiovascular: Negative for chest pain.  Gastrointestinal: Negative for abdominal pain.  Musculoskeletal: Positive for back pain.  Skin: Negative for rash.       Objective:   Physical Exam  Constitutional: She appears well-developed and well-nourished.  Cardiovascular: Normal rate and regular rhythm.   Pulmonary/Chest: Effort normal and breath sounds normal. No respiratory distress. She has no wheezes. She has no rales.  Musculoskeletal:  No thoracic spine tenderness. She has some minimal tenderness parathoracic musculature on both sides          Assessment & Plan:  Thoracic back  pain. Suspect muscular. Try topicals such as bio freeze. Motrin 800 mg every 8 hours with food as needed. Robaxin 500 mg each bedtime. Touch base one to 2 weeks if no better

## 2013-11-23 NOTE — Patient Instructions (Signed)
Thoracic Strain You have injured the muscles or tendons that attach to the upper part of your back behind your chest. This injury is called a thoracic strain, thoracic sprain, or mid-back strain.  CAUSES  The cause of thoracic strain varies. A less severe injury involves pulling a muscle or tendon without tearing it. A more severe injury involves tearing (rupturing) a muscle or tendon. With less severe injuries, there may be little loss of strength. Sometimes, there are breaks (fractures) in the bones to which the muscles are attached. These fractures are rare, unless there was a direct hit (trauma) or you have weak bones due to osteoporosis or age. Longstanding strains may be caused by overuse or improper form during certain movements. Obesity can also increase your risk for back injuries. Sudden strains may occur due to injury or not warming up properly before exercise. Often, there is no obvious cause for a thoracic strain. SYMPTOMS  The main symptom is pain, especially with movement, such as during exercise. DIAGNOSIS  Your caregiver can usually tell what is wrong by taking an X-ray and doing a physical exam. TREATMENT   Physical therapy may be helpful for recovery. Your caregiver can give you exercises to do or refer you to a physical therapist after your pain improves.  After your pain improves, strengthening and conditioning programs appropriate for your sport or occupation may be helpful.  Always warm up before physical activities or athletics. Stretching after physical activity may also help.  Certain over-the-counter medicines may also help. Ask your caregiver if there are medicines that would help you. If this is your first thoracic strain injury, proper care and proper healing time before starting activities should prevent long-term problems. Torn ligaments and tendons require as long to heal as broken bones. Average healing times may be only 1 week for a mild strain. For torn muscles  and tendons, healing time may be up to 6 weeks to 2 months. HOME CARE INSTRUCTIONS   Apply ice to the injured area. Ice massages may also be used as directed.  Put ice in a plastic bag.  Place a towel between your skin and the bag.  Leave the ice on for 15-20 minutes, 03-04 times a day, for the first 2 days.  Only take over-the-counter or prescription medicines for pain, discomfort, or fever as directed by your caregiver.  Keep your appointments for physical therapy if this was prescribed.  Use wraps and back braces as instructed. SEEK IMMEDIATE MEDICAL CARE IF:   You have an increase in bruising, swelling, or pain.  Your pain has not improved with medicines.  You develop new shortness of breath, chest pain, or fever.  Problems seem to be getting worse rather than better. MAKE SURE YOU:   Understand these instructions.  Will watch your condition.  Will get help right away if you are not doing well or get worse. Document Released: 05/30/2003 Document Revised: 06/01/2011 Document Reviewed: 04/25/2010 [employer] Patient Information 2015 [employer]. This information is not intended to replace advice given to you by your health care provider. Make sure you discuss any questions you have with your health care provider.  

## 2013-11-23 NOTE — Progress Notes (Signed)
Pre visit review using our clinic review tool, if applicable. No additional management support is needed unless otherwise documented below in the visit note. 

## 2014-01-02 ENCOUNTER — Telehealth: Payer: Self-pay | Admitting: Family Medicine

## 2014-01-02 NOTE — Telephone Encounter (Signed)
Pt request refill albuterol (PROAIR HFA) 108 (90 BASE) MCG/ACT inhaler Pt states the ventolin is not working for her. [facility]/[address]

## 2014-01-03 MED ORDER — ALBUTEROL SULFATE HFA 108 (90 BASE) MCG/ACT IN AERS: 2.0000 | INHALATION_SPRAY | RESPIRATORY_TRACT | Status: DC | PRN

## 2014-01-03 NOTE — Telephone Encounter (Signed)
Rx sent to pharmacy   

## 2014-05-18 ENCOUNTER — Ambulatory Visit (INDEPENDENT_AMBULATORY_CARE_PROVIDER_SITE_OTHER): Payer: 59 | Admitting: Family Medicine

## 2014-05-18 ENCOUNTER — Telehealth: Payer: Self-pay

## 2014-05-18 ENCOUNTER — Encounter: Payer: Self-pay | Admitting: Family Medicine

## 2014-05-18 VITALS — BP 102/80 | HR 85 | Temp 98.2°F | Wt 152.0 lb

## 2014-05-18 DIAGNOSIS — R1310 Dysphagia, unspecified: Secondary | ICD-10-CM

## 2014-05-18 DIAGNOSIS — R42 Dizziness and giddiness: Secondary | ICD-10-CM

## 2014-05-18 LAB — CBC WITH DIFFERENTIAL/PLATELET
BASOS ABS: 0 10*3/uL (ref 0.0–0.1)
Basophils Relative: 0.3 % (ref 0.0–3.0)
EOS PCT: 5.7 % — AB (ref 0.0–5.0)
Eosinophils Absolute: 0.5 10*3/uL (ref 0.0–0.7)
HEMATOCRIT: 36.5 % (ref 36.0–46.0)
Hemoglobin: 12.2 g/dL (ref 12.0–15.0)
LYMPHS ABS: 2.7 10*3/uL (ref 0.7–4.0)
LYMPHS PCT: 28.7 % (ref 12.0–46.0)
MCHC: 33.4 g/dL (ref 30.0–36.0)
MCV: 84.4 fl (ref 78.0–100.0)
MONOS PCT: 6 % (ref 3.0–12.0)
Monocytes Absolute: 0.6 10*3/uL (ref 0.1–1.0)
Neutro Abs: 5.7 10*3/uL (ref 1.4–7.7)
Neutrophils Relative %: 59.3 % (ref 43.0–77.0)
PLATELETS: 282 10*3/uL (ref 150.0–400.0)
RBC: 4.33 Mil/uL (ref 3.87–5.11)
RDW: 16.4 % — ABNORMAL HIGH (ref 11.5–15.5)
WBC: 9.6 10*3/uL (ref 4.0–10.5)

## 2014-05-18 NOTE — Telephone Encounter (Signed)
Those are OTC.

## 2014-05-18 NOTE — Telephone Encounter (Signed)
Pt wants to know if she can get a Rx for a patch to stop smoking.

## 2014-05-18 NOTE — Telephone Encounter (Signed)
Left message for patient to return call.

## 2014-05-18 NOTE — Patient Instructions (Signed)
Dysphagia Swallowing problems (dysphagia) occur when solids and liquids seem to stick in your throat on the way down to your stomach, or the food takes longer to get to the stomach. Other symptoms include regurgitating food, noises coming from the throat, chest discomfort with swallowing, and a feeling of fullness or the feeling of something being stuck in your throat when swallowing. When blockage in your throat is complete, it may be associated with drooling. CAUSES  Problems with swallowing may occur because of problems with the muscles. The food cannot be propelled in the usual manner into your stomach. You may have ulcers, scar tissue, or inflammation in the tube down which food travels from your mouth to your stomach (esophagus), which blocks food from passing normally into the stomach. Causes of inflammation include:  Acid reflux from your stomach into your esophagus.  Infection.  Radiation treatment for cancer.  Medicines taken without enough fluids to wash them down into your stomach. You may have nerve problems that prevent signals from being sent to the muscles of your esophagus to contract and move your food down to your stomach. Globus pharyngeus is a relatively common problem in which there is a sense of an obstruction or difficulty in swallowing, without any physical abnormalities of the swallowing passages being found. This problem usually improves over time with reassurance and testing to rule out other causes. DIAGNOSIS Dysphagia can be diagnosed and its cause can be determined by tests in which you swallow a white substance that helps illuminate the inside of your throat (contrast medium) while X-rays are taken. Sometimes a flexible telescope that is inserted down your throat (endoscopy) to look at your esophagus and stomach is used. TREATMENT   If the dysphagia is caused by acid reflux or infection, medicines may be used.  If the dysphagia is caused by problems with your  swallowing muscles, swallowing therapy may be used to help you strengthen your swallowing muscles.  If the dysphagia is caused by a blockage or mass, procedures to remove the blockage may be done. HOME CARE INSTRUCTIONS  Try to eat soft food that is easier to swallow and check your weight on a daily basis to be sure that it is not decreasing.  Be sure to drink liquids when sitting upright (not lying down). SEEK MEDICAL CARE IF:  You are losing weight because you are unable to swallow.  You are coughing when you drink liquids (aspiration).  You are coughing up partially digested food. SEEK IMMEDIATE MEDICAL CARE IF:  You are unable to swallow your own saliva .  You are having shortness of breath or a fever, or both.  You have a hoarse voice along with difficulty swallowing. MAKE SURE YOU:  Understand these instructions.  Will watch your condition.  Will get help right away if you are not doing well or get worse. Document Released: 03/06/2000 Document Revised: 07/24/2013 Document Reviewed: 08/26/2012 [facility] Patient Information 2015 [place], [place]. This information is not intended to replace advice given to you by your health care provider. Make sure you discuss any questions you have with your health care provider.  Call me in one week if dysphagia/swallowing difficulties not improving.

## 2014-05-18 NOTE — Progress Notes (Signed)
   Subjective:    Patient ID: Alicia Wells, female    DOB: 1980-04-21, 34 y.o.   MRN: [id number]  HPI Patient seen for the following 2 acute and new issues  Lightheadedness for 3 days. No syncope but presyncopal type symptoms. She notices symptoms are worse when standing up after sitting. She has normal menses. Denies history of anemia. No recent nausea, vomiting, or diarrhea. Does not feel dehydrated. Denies any focal weakness. No confusion. No palpitations. No chest pains.  She's had some mild dysphagia this past week. She notices sometimes food feels like it is stuck in her throat. After drinking increased fluids things seemed to improve. She's not had any active GERD symptoms. No dysphagia to pills. No recent appetite or weight changes. No odynophagia.  Past Medical History  Diagnosis Date  . Allergy   . Blood transfusion without reported diagnosis   . Depression   . Anxiety   . Headache(784.0)    No past surgical history on file.  reports that she has been smoking Cigarettes.  She started smoking about 12 years ago. She has been smoking about 1.00 pack per day. She does not have any smokeless tobacco history on file. She reports that she does not use illicit drugs. Her alcohol history is not on file. family history includes Diabetes in her father. Allergies  Allergen Reactions  . Penicillins       Review of Systems  Constitutional: Negative for appetite change and unexpected weight change.  Respiratory: Negative for cough and shortness of breath.   Cardiovascular: Negative for chest pain, palpitations and leg swelling.  Genitourinary: Negative for dysuria.  Neurological: Positive for dizziness. Negative for syncope and weakness.  Hematological: Negative for adenopathy. Does not bruise/bleed easily.       Objective:   Physical Exam  Constitutional: She is oriented to person, place, and time. She appears well-developed and well-nourished.  HENT:  Right Ear: External  ear normal.  Left Ear: External ear normal.  Mouth/Throat: Oropharynx is clear and moist.  Neck: Neck supple. No thyromegaly present.  Cardiovascular: Normal rate and regular rhythm.  Exam reveals no gallop.   No murmur heard. Pulmonary/Chest: Effort normal and breath sounds normal. No respiratory distress. She has no wheezes. She has no rales.  Musculoskeletal: She exhibits no edema.  Neurological: She is alert and oriented to person, place, and time. No cranial nerve deficit.  Psychiatric: She has a normal mood and affect.          Assessment & Plan:  #1 lightheadedness. Standing blood pressure today 98/60 but I obtained basically the same reading with her sitting. Clinically, doubt anemia but will check CBC to be sure. Increase hydration. Change positions slowly #2 solid food dysphagia for the past week. Symptoms are relatively mild. No red flags such as appetite or weight changes. She wishes to observe for one week.  If no better by then GI referral.

## 2014-05-18 NOTE — Telephone Encounter (Signed)
Called and spoke with pt and pt is aware of Dr. Erick Blinks recommendations

## 2014-05-18 NOTE — Progress Notes (Signed)
Pre visit review using our clinic review tool, if applicable. No additional management support is needed unless otherwise documented below in the visit note. 

## 2014-05-21 ENCOUNTER — Telehealth: Payer: Self-pay | Admitting: Family Medicine

## 2014-05-21 NOTE — Telephone Encounter (Signed)
emmi emailed °

## 2014-08-23 ENCOUNTER — Encounter: Payer: Self-pay | Admitting: Family Medicine

## 2014-08-23 ENCOUNTER — Ambulatory Visit (INDEPENDENT_AMBULATORY_CARE_PROVIDER_SITE_OTHER): Payer: 59 | Admitting: Family Medicine

## 2014-08-23 VITALS — BP 110/70 | HR 72 | Temp 98.3°F | Wt 163.0 lb

## 2014-08-23 DIAGNOSIS — G4700 Insomnia, unspecified: Secondary | ICD-10-CM

## 2014-08-23 MED ORDER — TRAZODONE HCL 50 MG PO TABS: 50.0000 mg | ORAL_TABLET | Freq: Every day | ORAL | Status: DC

## 2014-08-23 NOTE — Progress Notes (Signed)
   Subjective:    Patient ID: Alicia Wells, female    DOB: May 09, 1980, 34 y.o.   MRN: [id number]  HPI Patient seen for insomnia issues. She works third shift from 7 PM to 3 AM. Generally goes to sleep around 7 AM. She has tremendous difficulty falling asleep. She does consume caffeine but usually not after midnight. No alcohol use. She has tried melatonin without much relief. In a drill help somewhat. She still has difficulty staying asleep even with Benadryl. She tends to worry in general but denies any specific stressors. Denies any depressed symptoms. Does not take any decongestions or stimulant medications.  Past Medical History  Diagnosis Date  . Allergy   . Blood transfusion without reported diagnosis   . Depression   . Anxiety   . Headache(784.0)    No past surgical history on file.  reports that she has been smoking Cigarettes.  She started smoking about 12 years ago. She has been smoking about 1.00 pack per day. She does not have any smokeless tobacco history on file. She reports that she does not use illicit drugs. Her alcohol history is not on file. family history includes Diabetes in her father. Allergies  Allergen Reactions  . Penicillins       Review of Systems  Constitutional: Negative for appetite change and unexpected weight change.  Psychiatric/Behavioral: Positive for sleep disturbance. Negative for dysphoric mood.       Objective:   Physical Exam  Constitutional: She is oriented to person, place, and time. She appears well-developed and well-nourished.  Neck: Neck supple. No thyromegaly present.  Cardiovascular: Normal rate and regular rhythm.   Pulmonary/Chest: Effort normal and breath sounds normal. No respiratory distress. She has no wheezes. She has no rales.  Lymphadenopathy:    She has no cervical adenopathy.  Neurological: She is alert and oriented to person, place, and time.  Psychiatric: She has a normal mood and affect. Her behavior is normal.  Judgment and thought content normal.          Assessment & Plan:  Insomnia. Sleep hygiene discussed. Continue Benadryl as needed. If this is not working trial of trazodone 50 mg daily at bedtime.

## 2014-08-23 NOTE — Progress Notes (Signed)
Pre visit review using our clinic review tool, if applicable. No additional management support is needed unless otherwise documented below in the visit note. 

## 2014-08-23 NOTE — Patient Instructions (Signed)
Insomnia Insomnia is frequent trouble falling and/or staying asleep. Insomnia can be a long term problem or a short term problem. Both are common. Insomnia can be a short term problem when the wakefulness is related to a certain stress or worry. Long term insomnia is often related to ongoing stress during waking hours and/or poor sleeping habits. Overtime, sleep deprivation itself can make the problem worse. Every little thing feels more severe because you are overtired and your ability to cope is decreased. CAUSES   Stress, anxiety, and depression.  Poor sleeping habits.  Distractions such as TV in the bedroom.  Naps close to bedtime.  Engaging in emotionally charged conversations before bed.  Technical reading before sleep.  Alcohol and other sedatives. They may make the problem worse. They can hurt normal sleep patterns and normal dream activity.  Stimulants such as caffeine for several hours prior to bedtime.  Pain syndromes and shortness of breath can cause insomnia.  Exercise late at night.  Changing time zones may cause sleeping problems (jet lag). It is sometimes helpful to have someone observe your sleeping patterns. They should look for periods of not breathing during the night (sleep apnea). They should also look to see how long those periods last. If you live alone or observers are uncertain, you can also be observed at a sleep clinic where your sleep patterns will be professionally monitored. Sleep apnea requires a checkup and treatment. Give your caregivers your medical history. Give your caregivers observations your family has made about your sleep.  SYMPTOMS   Not feeling rested in the morning.  Anxiety and restlessness at bedtime.  Difficulty falling and staying asleep. TREATMENT   Your caregiver may prescribe treatment for an underlying medical disorders. Your caregiver can give advice or help if you are using alcohol or other drugs for self-medication. Treatment  of underlying problems will usually eliminate insomnia problems.  Medications can be prescribed for short time use. They are generally not recommended for lengthy use.  Over-the-counter sleep medicines are not recommended for lengthy use. They can be habit forming.  You can promote easier sleeping by making lifestyle changes such as:  Using relaxation techniques that help with breathing and reduce muscle tension.  Exercising earlier in the day.  Changing your diet and the time of your last meal. No night time snacks.  Establish a regular time to go to bed.  Counseling can help with stressful problems and worry.  Soothing music and white noise may be helpful if there are background noises you cannot remove.  Stop tedious detailed work at least one hour before bedtime. HOME CARE INSTRUCTIONS   Keep a diary. Inform your caregiver about your progress. This includes any medication side effects. See your caregiver regularly. Take note of:  Times when you are asleep.  Times when you are awake during the night.  The quality of your sleep.  How you feel the next day. This information will help your caregiver care for you.  Get out of bed if you are still awake after 15 minutes. Read or do some quiet activity. Keep the lights down. Wait until you feel sleepy and go back to bed.  Keep regular sleeping and waking hours. Avoid naps.  Exercise regularly.  Avoid distractions at bedtime. Distractions include watching television or engaging in any intense or detailed activity like attempting to balance the household checkbook.  Develop a bedtime ritual. Keep a familiar routine of bathing, brushing your teeth, climbing into bed at the same   time each night, listening to soothing music. Routines increase the success of falling to sleep faster.  Use relaxation techniques. This can be using breathing and muscle tension release routines. It can also include visualizing peaceful scenes. You can  also help control troubling or intruding thoughts by keeping your mind occupied with boring or repetitive thoughts like the old concept of counting sheep. You can make it more creative like imagining planting one beautiful flower after another in your backyard garden.  During your day, work to eliminate stress. When this is not possible use some of the previous suggestions to help reduce the anxiety that accompanies stressful situations. MAKE SURE YOU:   Understand these instructions.  Will watch your condition.  Will get help right away if you are not doing well or get worse. Document Released: 03/06/2000 Document Revised: 06/01/2011 Document Reviewed: 04/06/2007 ExitCare Patient Information 2015 [employer]. This information is not intended to replace advice given to you by your health care provider. Make sure you discuss any questions you have with your health care provider.  

## 2014-09-29 ENCOUNTER — Other Ambulatory Visit: Payer: Self-pay | Admitting: Family Medicine

## 2014-10-22 ENCOUNTER — Telehealth: Payer: Self-pay | Admitting: Family Medicine

## 2014-10-22 MED ORDER — ALBUTEROL SULFATE HFA 108 (90 BASE) MCG/ACT IN AERS: INHALATION_SPRAY | RESPIRATORY_TRACT | Status: DC

## 2014-10-22 NOTE — Telephone Encounter (Signed)
Rx sent to pharmacy   

## 2014-10-22 NOTE — Telephone Encounter (Signed)
Pt would like to go back to pro-air inhaler . Pt can not take ventolin inhaler due to dizziness and not been able to sleep. [facility] [place]

## 2014-10-22 NOTE — Telephone Encounter (Signed)
Okay to discontinue Ventolin and start pro-air 2 puffs every 4 hours as needed. This should only be used as a rescue inhaler and not daily

## 2014-10-27 ENCOUNTER — Other Ambulatory Visit: Payer: Self-pay | Admitting: Family Medicine

## 2014-10-30 ENCOUNTER — Telehealth: Payer: Self-pay | Admitting: Family Medicine

## 2014-10-30 MED ORDER — ALBUTEROL SULFATE HFA 108 (90 BASE) MCG/ACT IN AERS: INHALATION_SPRAY | RESPIRATORY_TRACT | Status: DC

## 2014-10-30 NOTE — Telephone Encounter (Signed)
Patient states that the pharmacy has not received her RX refill. Patient wants to know if the RX can be resent to the pharmacy.

## 2014-10-30 NOTE — Telephone Encounter (Signed)
Rx sent to pharmacy   

## 2014-11-26 ENCOUNTER — Ambulatory Visit (INDEPENDENT_AMBULATORY_CARE_PROVIDER_SITE_OTHER): Payer: 59 | Admitting: Family Medicine

## 2014-11-26 VITALS — BP 108/68 | HR 86 | Temp 97.9°F | Resp 16 | Ht 66.0 in | Wt 157.4 lb

## 2014-11-26 DIAGNOSIS — F411 Generalized anxiety disorder: Secondary | ICD-10-CM | POA: Diagnosis not present

## 2014-11-26 DIAGNOSIS — R0602 Shortness of breath: Secondary | ICD-10-CM | POA: Diagnosis not present

## 2014-11-26 DIAGNOSIS — F17200 Nicotine dependence, unspecified, uncomplicated: Secondary | ICD-10-CM

## 2014-11-26 DIAGNOSIS — Z720 Tobacco use: Secondary | ICD-10-CM | POA: Diagnosis not present

## 2014-11-26 DIAGNOSIS — J4521 Mild intermittent asthma with (acute) exacerbation: Secondary | ICD-10-CM | POA: Diagnosis not present

## 2014-11-26 MED ORDER — ALBUTEROL SULFATE (2.5 MG/3ML) 0.083% IN NEBU
2.5000 mg | INHALATION_SOLUTION | Freq: Once | RESPIRATORY_TRACT | Status: AC
  Administered 2014-11-26: 2.5 mg via RESPIRATORY_TRACT

## 2014-11-26 MED ORDER — LORAZEPAM 0.5 MG PO TABS: 0.5000 mg | ORAL_TABLET | Freq: Two times a day (BID) | ORAL | Status: DC | PRN

## 2014-11-26 MED ORDER — ALBUTEROL SULFATE HFA 108 (90 BASE) MCG/ACT IN AERS: 2.0000 | INHALATION_SPRAY | Freq: Four times a day (QID) | RESPIRATORY_TRACT | Status: DC | PRN

## 2014-11-26 MED ORDER — PREDNISONE 20 MG PO TABS: 20.0000 mg | ORAL_TABLET | Freq: Every day | ORAL | Status: DC

## 2014-11-26 NOTE — Progress Notes (Signed)
Short of breath  Subjective:  Patient ID: Alicia Wells, female    DOB: 07-29-1980  Age: 34 y.o. MRN: [id number]  34 year old lady who works for the [employer]. She has a long history of asthma. She has an albuterol inhaler, Pro Air, that she has used about 4 times a day the last few days. She does continue to smoke, is trying to quit, is down to about a half dozen cigarettes per day. She has been short of breath the last couple of days. Yesterday she had an alcoholic drink and that made her feel even worse with tightness of breath. She has also been drinking energy drinks. She took some Z-Quel last night and couldn't sleep. She sat up and had trouble breathing. She still feels short of breath this morning.   Objective:   Pulse oximetry is good with O2 sat 98%. She looks tired but not acutely dyspneic. Throat clear. Neck supple without significant nodes. Chest does not have any wheezing at this time. Heart regular without murmurs. No edema.  Assessment & Plan:   Assessment:  Shortness of breath Asthma Tobacco use disorder Anxiety  Plan:  Stressed the importance of stopping smoking. The energy drinks also may be adding to the way she feels. Will treat her lungs with prednisone. Also give her something to relax her little bit the next few days.  There are no Patient Instructions on file for this visit.   Daxtyn Rottenberg, MD 11/26/2014

## 2014-11-26 NOTE — Patient Instructions (Signed)
Drink plenty of fluids  Discontinue the cigarettes. You are not smoking very much. You just need to pick a quit date, focus on that, and quit. Stop making excuses.  Avoid the energy drinks which will just make you more nervous  Take the albuterol inhaler 2 inhalations every 4-6 hours when needed for shortness of breath or wheezing  Take the prednisone 3 pills today, then 2 daily for 2 days, then 1 daily for 2 days  Return or go to the emergency room if worse  Use the antianxiety medication, lorazepam, 0.5 mg 1 pill twice daily only when needed for anxiety. He seemed to be anxious which is probably making the breathing worse.

## 2014-11-27 ENCOUNTER — Encounter: Payer: Self-pay | Admitting: Family Medicine

## 2014-11-27 ENCOUNTER — Ambulatory Visit (INDEPENDENT_AMBULATORY_CARE_PROVIDER_SITE_OTHER): Payer: 59 | Admitting: Family Medicine

## 2014-11-27 ENCOUNTER — Telehealth: Payer: Self-pay | Admitting: Family Medicine

## 2014-11-27 VITALS — BP 100/76 | HR 91 | Temp 97.5°F | Ht 66.0 in | Wt 156.9 lb

## 2014-11-27 DIAGNOSIS — J4541 Moderate persistent asthma with (acute) exacerbation: Secondary | ICD-10-CM | POA: Diagnosis not present

## 2014-11-27 DIAGNOSIS — J309 Allergic rhinitis, unspecified: Secondary | ICD-10-CM

## 2014-11-27 NOTE — Progress Notes (Signed)
Pre visit review using our clinic review tool, if applicable. No additional management support is needed unless otherwise documented below in the visit note. 

## 2014-11-27 NOTE — Telephone Encounter (Signed)
Patient Name: Alicia Wells  DOB: [birth year]    Initial Comment Caller states, when she eats her food feels like it stops in her chest, she is having trouble swallowing. She also has trouble breathing.    Nurse Assessment  Nurse: Leilani Merl, RN, Heather Date/Time (Eastern Time): 11/27/2014 10:35:53 AM  Confirm and document reason for call. If symptomatic, describe symptoms. ---Caller states, when she eats her food feels like it stops in her chest, she is having trouble swallowing. She also has trouble breathing. This started on Sunday night after she took zzzquil. She went to an urgent care yesterday and they gave her steroids to help her breathe.  Has the patient traveled out of the country within the last 30 days? ---Not Applicable  Does the patient require triage? ---Yes  Related visit to physician within the last 2 weeks? ---Yes  Does the PT have any chronic conditions? (i.e. diabetes, asthma, etc.) ---Yes  List chronic conditions. ---allergies  Did the patient indicate they were pregnant? ---No     Guidelines    Guideline Title Affirmed Question Affirmed Notes  Swallowing Difficulty [1] Swallowing difficulty AND [2] cause unknown (Exception: difficulty swallowing is a chronic symptom)    Final Disposition User   See Physician within 24 Hours Standifer, RN, Water quality scientist    Comments  Appt made with Dr. Maudie Mercury for today at 1 pm.   Referrals  REFERRED TO PCP OFFICE   Disagree/Comply: Comply

## 2014-11-27 NOTE — Progress Notes (Signed)
HPI:  Alicia Wells is a 34 yo F pt of Dr. Elease Hashimoto with a PMH sig for Asthma, allergies and anxiety here for an acute visit for:  PND/Congestion/Wheezing: -reports started about 3-4 days ago -symptoms: congestion, PND, tongue feels dry, some wheezing at night, globus sensation a few times -this had allergy to alcohol as had one drink Friday and then sleeping medication with alcohol in it Saturday  -reports seen in [facility] yesterday and given prednisone taper and if feeling a little better today -has used her albuterol a few times, none today -denies fevers, SOB today, CP, sinus pain, NVD, rash, acid reflux, sore throat  ROS: See pertinent positives and negatives per HPI.  Past Medical History  Diagnosis Date  . Allergy   . Blood transfusion without reported diagnosis   . Depression   . Anxiety   . Headache(784.0)     No past surgical history on file.  Family History  Problem Relation Age of Onset  . Diabetes Father     Social History   Social History  . Marital Status: Single    Spouse Name: N/A  . Number of Children: N/A  . Years of Education: N/A   Social History Main Topics  . Smoking status: Current Every Day Smoker -- 1.00 packs/day    Types: Cigarettes    Start date: 01/12/2002  . Smokeless tobacco: None  . Alcohol Use: None  . Drug Use: No  . Sexual Activity: Not Asked   Other Topics Concern  . None   Social History Narrative     Current outpatient prescriptions:  .  albuterol (PROAIR HFA) 108 (90 BASE) MCG/ACT inhaler, inhale 2 puffs every 4 hours if needed. ProAir only., Disp: 18 g, Rfl: 1 .  albuterol (PROAIR HFA) 108 (90 BASE) MCG/ACT inhaler, Inhale 2 puffs into the lungs every 6 (six) hours as needed for wheezing or shortness of breath., Disp: 8.5 Inhaler, Rfl: 1 .  LORazepam (ATIVAN) 0.5 MG tablet, Take 1 tablet (0.5 mg total) by mouth 2 (two) times daily as needed for anxiety., Disp: 20 tablet, Rfl: 0 .  predniSONE (DELTASONE) 20 MG  tablet, Take 1 tablet (20 mg total) by mouth daily with breakfast., Disp: 9 tablet, Rfl: 0 .  traZODone (DESYREL) 50 MG tablet, Take 1 tablet (50 mg total) by mouth at bedtime., Disp: 30 tablet, Rfl: 5  EXAM:  Filed Vitals:   11/27/14 1156  BP: 100/76  Pulse: 91  Temp: 97.5 F (36.4 C)    Body mass index is 25.34 kg/(m^2).  GENERAL: vitals reviewed and listed above, alert, oriented, appears well hydrated and in no acute distress  HEENT: atraumatic, conjunttiva clear, no obvious abnormalities on inspection of external nose and ears, normal appearance of ear canals and TMs, clear nasal congestion, bogy turbinates,  mild post oropharyngeal erythema with PND, tongue appears normal, no tonsillar edema or exudate, no sinus TTP  NECK: no obvious masses on inspection, no wheeze  LUNGS: clear to auscultation bilaterally, no wheezes, rales or rhonchi, good air movement  CV: HRRR, no peripheral edema  MS: moves all extremities without noticeable abnormality  PSYCH: pleasant and cooperative, no obvious depression or anxiety  ASSESSMENT AND PLAN:  Discussed the following assessment and plan:  Asthma, moderate persistent, with acute exacerbation  Allergic rhinitis, unspecified allergic rhinitis type  -continue/complete prednisone course, alb prn, add zyrtec as appears to have uncontrolled AR, may need nasal steroid as well  -follow up as needed if persists, return and  emergency precautions, ENT info provided in case globus sensation persists and she opts for this route vs returning hear -Patient advised to return or notify a doctor immediately if symptoms worsen or persist or new concerns arise.  Patient Instructions  -complete the prednisone course  -start zyrtec and take once nightly  -follow up as needed     [name], Jarrett Soho R.

## 2014-11-27 NOTE — Telephone Encounter (Signed)
Noted. Pt is seeing Dr. Maudie Mercury 11/27/14 at 1:00pm

## 2014-11-27 NOTE — Patient Instructions (Signed)
-  complete the prednisone course  -start zyrtec and take once nightly  -follow up as needed

## 2014-11-27 NOTE — Telephone Encounter (Signed)
Patient seen by Dr Maudie Mercury at 12pm.

## 2014-11-28 ENCOUNTER — Emergency Department (HOSPITAL_COMMUNITY)
Admission: EM | Admit: 2014-11-28 | Discharge: 2014-11-28 | Disposition: A | Discharge status: Home / Self Care | Payer: 59 | Attending: Emergency Medicine | Admitting: Emergency Medicine

## 2014-11-28 ENCOUNTER — Encounter (HOSPITAL_COMMUNITY): Payer: Self-pay

## 2014-11-28 ENCOUNTER — Emergency Department (HOSPITAL_COMMUNITY): Payer: 59

## 2014-11-28 DIAGNOSIS — Z720 Tobacco use: Secondary | ICD-10-CM | POA: Diagnosis not present

## 2014-11-28 DIAGNOSIS — Z7952 Long term (current) use of systemic steroids: Secondary | ICD-10-CM | POA: Insufficient documentation

## 2014-11-28 DIAGNOSIS — F419 Anxiety disorder, unspecified: Secondary | ICD-10-CM | POA: Insufficient documentation

## 2014-11-28 DIAGNOSIS — J45901 Unspecified asthma with (acute) exacerbation: Secondary | ICD-10-CM | POA: Insufficient documentation

## 2014-11-28 DIAGNOSIS — Z880 Allergy status to penicillin: Secondary | ICD-10-CM | POA: Insufficient documentation

## 2014-11-28 DIAGNOSIS — R079 Chest pain, unspecified: Secondary | ICD-10-CM

## 2014-11-28 DIAGNOSIS — F329 Major depressive disorder, single episode, unspecified: Secondary | ICD-10-CM | POA: Insufficient documentation

## 2014-11-28 DIAGNOSIS — K219 Gastro-esophageal reflux disease without esophagitis: Secondary | ICD-10-CM | POA: Diagnosis not present

## 2014-11-28 LAB — I-STAT CHEM 8, ED
BUN: 13 mg/dL (ref 6–20)
CHLORIDE: 107 mmol/L (ref 101–111)
CREATININE: 0.8 mg/dL (ref 0.44–1.00)
Calcium, Ion: 1.19 mmol/L (ref 1.12–1.23)
GLUCOSE: 125 mg/dL — AB (ref 65–99)
HCT: 38 % (ref 36.0–46.0)
Hemoglobin: 12.9 g/dL (ref 12.0–15.0)
POTASSIUM: 3.8 mmol/L (ref 3.5–5.1)
Sodium: 141 mmol/L (ref 135–145)
TCO2: 22 mmol/L (ref 0–100)

## 2014-11-28 MED ORDER — GI COCKTAIL ~~LOC~~
30.0000 mL | Freq: Once | ORAL | Status: AC
  Administered 2014-11-28: 30 mL via ORAL
  Filled 2014-11-28: qty 30

## 2014-11-28 MED ORDER — OMEPRAZOLE 20 MG PO CPDR: 20.0000 mg | DELAYED_RELEASE_CAPSULE | Freq: Two times a day (BID) | ORAL | Status: DC

## 2014-11-28 NOTE — Discharge Instructions (Signed)
Gastroesophageal Reflux Disease, Adult Gastroesophageal reflux disease (GERD) happens when acid from your stomach flows up into the esophagus. When acid comes in contact with the esophagus, the acid causes soreness (inflammation) in the esophagus. Over time, GERD may create small holes (ulcers) in the lining of the esophagus. CAUSES   Increased body weight. This puts pressure on the stomach, making acid rise from the stomach into the esophagus.  Smoking. This increases acid production in the stomach.  Drinking alcohol. This causes decreased pressure in the lower esophageal sphincter (valve or ring of muscle between the esophagus and stomach), allowing acid from the stomach into the esophagus.  Late evening meals and a full stomach. This increases pressure and acid production in the stomach.  A malformed lower esophageal sphincter. Sometimes, no cause is found. SYMPTOMS   Burning pain in the lower part of the mid-chest behind the breastbone and in the mid-stomach area. This may occur twice a week or more often.  Trouble swallowing.  Sore throat.  Dry cough.  Asthma-like symptoms including chest tightness, shortness of breath, or wheezing. DIAGNOSIS  Your caregiver may be able to diagnose GERD based on your symptoms. In some cases, X-rays and other tests may be done to check for complications or to check the condition of your stomach and esophagus. TREATMENT  Your caregiver may recommend over-the-counter or prescription medicines to help decrease acid production. Ask your caregiver before starting or adding any new medicines.  HOME CARE INSTRUCTIONS   Change the factors that you can control. Ask your caregiver for guidance concerning weight loss, quitting smoking, and alcohol consumption.  Avoid foods and drinks that make your symptoms worse, such as:  Caffeine or alcoholic drinks.  Chocolate.  Peppermint or mint flavorings.  Garlic and onions.  Spicy foods.  Citrus fruits,  such as oranges, lemons, or limes.  Tomato-based foods such as sauce, chili, salsa, and pizza.  Fried and fatty foods.  Avoid lying down for the 3 hours prior to your bedtime or prior to taking a nap.  Eat small, frequent meals instead of large meals.  Wear loose-fitting clothing. Do not wear anything tight around your waist that causes pressure on your stomach.  Raise the head of your bed 6 to 8 inches with wood blocks to help you sleep. Extra pillows will not help.  Only take over-the-counter or prescription medicines for pain, discomfort, or fever as directed by your caregiver.  Do not take aspirin, ibuprofen, or other nonsteroidal anti-inflammatory drugs (NSAIDs). SEEK IMMEDIATE MEDICAL CARE IF:   You have pain in your arms, neck, jaw, teeth, or back.  Your pain increases or changes in intensity or duration.  You develop nausea, vomiting, or sweating (diaphoresis).  You develop shortness of breath, or you faint.  Your vomit is green, yellow, black, or looks like coffee grounds or blood.  Your stool is red, bloody, or black. These symptoms could be signs of other problems, such as heart disease, gastric bleeding, or esophageal bleeding. MAKE SURE YOU:   Understand these instructions.  Will watch your condition.  Will get help right away if you are not doing well or get worse. Document Released: 12/17/2004 Document Revised: 06/01/2011 Document Reviewed: 09/26/2010 Exit[employer] Patient Information 2015 [employer]. This information is not intended to replace advice given to you by your health care provider. Make sure you discuss any questions you have with your health care provider.  

## 2014-11-28 NOTE — ED Provider Notes (Signed)
CSN: [id number]     Arrival date & time 11/28/14  2353 History   First MD Initiated Contact with Patient 11/28/14 [phone]     Chief Complaint  Patient presents with  . Shortness of Breath      The history is provided by the patient.   patient presents the emergency department with intermittent complaints of chest discomfort and shortness of breath.  No exertional chest pain or shortness of breath.  Sometimes is associated with lying flat and sometimes associated with eating food.  It does feel like a bad taste in her mouth.  She's never been diagnosed with gastroesophageal reflux disease.  At time she reports a sensation of food becoming stuck in her upper esophagus but she's never had what sounds like food impaction symptoms.  Denies nausea and vomiting.  No history of pulmonary embolism or DVT.  No family history of venous thromboembolic disease.  No cardiac history.  No family history of early heart disease.  She does have a history of bronchospasm and asthma and tried her inhaler today but she feels like this worsened her symptoms.  No shortness of breath at this time.  Ambulated to the emergency department briskly without any shortness of breath or chest pain.  Past Medical History  Diagnosis Date  . Allergy   . Blood transfusion without reported diagnosis   . Depression   . Anxiety   . Headache(784.0)    History reviewed. No pertinent past surgical history. Family History  Problem Relation Age of Onset  . Diabetes Father    Social History  Substance Use Topics  . Smoking status: Current Every Day Smoker -- 1.00 packs/day    Types: Cigarettes    Start date: 01/12/2002  . Smokeless tobacco: None  . Alcohol Use: No   OB History    No data available     Review of Systems  All other systems reviewed and are negative.     Allergies  Penicillins  Home Medications   Prior to Admission medications   Medication Sig Start Date End Date Taking? Authorizing Provider  albuterol  (PROAIR HFA) 108 (90 BASE) MCG/ACT inhaler Inhale 2 puffs into the lungs every 6 (six) hours as needed for wheezing or shortness of breath. 11/26/14  Yes Posey Boyer, MD  LORazepam (ATIVAN) 0.5 MG tablet Take 1 tablet (0.5 mg total) by mouth 2 (two) times daily as needed for anxiety. Patient taking differently: Take 0.25-0.5 mg by mouth 2 (two) times daily as needed for anxiety.  11/26/14  Yes Posey Boyer, MD  Multiple Vitamins-Minerals (HAIR/SKIN/NAILS PO) Take 2 tablets by mouth daily.   Yes Historical Provider, MD  predniSONE (DELTASONE) 20 MG tablet Take 1 tablet (20 mg total) by mouth daily with breakfast. Patient taking differently: Take 20-60 mg by mouth daily with breakfast. Take 3 tablets on day one, 2 tablets for the  Next two days and 1 tablet for the last two days. 11/26/14  Yes Posey Boyer, MD   BP 106/67 mmHg  Pulse 71  Temp(Src) 98.7 F (37.1 C) (Oral)  Resp 16  Ht 5\' 6"  (1.676 m)  Wt 156 lb (70.761 kg)  BMI 25.19 kg/m2  SpO2 100%  LMP 11/20/2014 Physical Exam  Constitutional: She is oriented to person, place, and time. She appears well-developed and well-nourished. No distress.  HENT:  Head: Normocephalic and atraumatic.  Eyes: EOM are normal.  Neck: Normal range of motion.  Cardiovascular: Normal rate, regular rhythm and normal heart  sounds.   Pulmonary/Chest: Effort normal and breath sounds normal.  Abdominal: Soft. She exhibits no distension. There is no tenderness.  Musculoskeletal: Normal range of motion.  Neurological: She is alert and oriented to person, place, and time.  Skin: Skin is warm and dry.  Psychiatric: She has a normal mood and affect. Judgment normal.  Nursing note and vitals reviewed.   ED Course  Procedures (including critical care time) Labs Review Labs Reviewed  I-STAT CHEM 8, ED - Abnormal; Notable for the following:    Glucose, Bld 125 (*)    All other components within normal limits    Imaging Review Dg Chest 2 View  11/28/2014    CLINICAL DATA:  Shortness of breath.  Chest pressure.  Allergies.  EXAM: CHEST  2 VIEW  COMPARISON:  05/11/2007  FINDINGS: The heart size and mediastinal contours are within normal limits. Both lungs are clear. The visualized skeletal structures are unremarkable.  IMPRESSION: No active cardiopulmonary disease.   Electronically Signed   By: Lucienne Capers M.D.   On: 11/28/2014 06:59   I have personally reviewed and evaluated these images and lab results as part of my medical decision-making.   EKG Interpretation None      MDM   Final diagnoses:  None    I ambulated with the patient in the emergency department and she did fine.  She had no complaints of shortness of breath.  Her walking pulse ox was 97%.  Her symptoms sound more related to esophagitis/gastroesophageal reflux disease.  Patient be placed on twice a day Prilosec.  GI follow-up.  She may benefit from endoscopy if her symptoms do not improve.  I suspect with PPI treatment she will improve.  She understands to return to the ER for new or worsening symptoms.  Primary care follow-up as well.    Jola Schmidt, MD 11/28/14 [phone]

## 2014-11-28 NOTE — ED Notes (Signed)
Pt states she has been SOB for a couple days and feels like food gets stuck in her chest after she eats. Also reports that her tongue burns when she eats no matter what she eats.

## 2014-11-30 ENCOUNTER — Telehealth: Payer: Self-pay | Admitting: Family Medicine

## 2014-11-30 NOTE — Telephone Encounter (Signed)
I would consider having her add Zantac or Pepcid over-the-counter and take twice daily-in addition to the omeprazole and schedule follow-up early next week

## 2014-11-30 NOTE — Telephone Encounter (Signed)
Pt went to ed on wed and advised to fu w/ GI asap.  Pt states the med they gave her is not working. Do you want me to work her in today or refer to GI asap?  Pls advie

## 2014-11-30 NOTE — Telephone Encounter (Signed)
Pt called to inquire about the GI referral ( see below note) . I told her I didn't see a referral in the system and explained the process. Pt states she was having shortness of breath. Transferrerd to the Triaged RN line. Please advise MD. Thanks

## 2014-11-30 NOTE — Telephone Encounter (Signed)
Patient Name: TESSICA CUPO DOB: [birth year] Initial Comment Caller states she is having shortness of breath. She was DX with Jerrye Bushy, on Wed. Nurse Assessment Nurse: Ronnald Ramp, RN, Miranda Date/Time (Eastern Time): 11/30/2014 10:35:44 AM Confirm and document reason for call. If symptomatic, describe symptoms. ---Caller states she diagnosed with GERD and started on medications 2 days ago. She is still having SOB and burping. When she burps she can feel acid coming up. Has the patient traveled out of the country within the last 30 days? ---Not Applicable Does the patient require triage? ---Yes Related visit to physician within the last 2 weeks? ---Yes Does the PT have any chronic conditions? (i.e. diabetes, asthma, etc.) ---No Did the patient indicate they were pregnant? ---No Guidelines Guideline Title Affirmed Question Affirmed Notes Abdominal Pain - Upper [1] SEVERE pain (e.g., excruciating) AND [2] present > 1 hour Final Disposition User Go to ED Now Ronnald Ramp, RN, [facility] - ED

## 2014-11-30 NOTE — Telephone Encounter (Signed)
Pt informed. Appt set

## 2014-12-03 ENCOUNTER — Encounter: Payer: Self-pay | Admitting: Family Medicine

## 2014-12-03 ENCOUNTER — Ambulatory Visit (INDEPENDENT_AMBULATORY_CARE_PROVIDER_SITE_OTHER): Payer: 59 | Admitting: Family Medicine

## 2014-12-03 VITALS — BP 118/74 | HR 71 | Temp 98.3°F | Wt 150.0 lb

## 2014-12-03 DIAGNOSIS — K219 Gastro-esophageal reflux disease without esophagitis: Secondary | ICD-10-CM | POA: Diagnosis not present

## 2014-12-03 DIAGNOSIS — F458 Other somatoform disorders: Secondary | ICD-10-CM

## 2014-12-03 DIAGNOSIS — R0989 Other specified symptoms and signs involving the circulatory and respiratory systems: Secondary | ICD-10-CM

## 2014-12-03 NOTE — Progress Notes (Signed)
Subjective:    Patient ID: Alicia Wells, female    DOB: December 12, 1980, 34 y.o.   MRN: [id number]  HPI  Patient seen for ER follow-up. She states around last Wednesday she was eating and noticed eating she had some difficulty swallowing. She did not have any true impaction and has never had any regurgitation. Since that time, she has had sensation of "globus "more in the lower esophageal region. She has been able to keep down some soft foods such as applesauce. She was to emergency department and evaluation there unremarkable. Still she had some anxiety symptoms and she was started on omeprazole and low-dose lorazepam which she has taken infrequently. She has sensation of feeling short of breath but normal pulse oximetry with ambulation was normal in ED. She thought she was having asthma flare initially and used her albuterol without improvement.  She has noticed some improvement since starting omeprazole. She's not had any vomiting. No pain with swallowing. Has reduced caffeine intake since last week. Rare alcohol use. Does have history of smoking. Poor appetite over the past week but not prior to then. She has lost some weight over the past several months and she takes a lot of this is been over the past few days. Currently taking omeprazole 20 mg twice daily and has seen some good improvement over the past 5 days  Past Medical History  Diagnosis Date  . Allergy   . Blood transfusion without reported diagnosis   . Depression   . Anxiety   . Headache(784.0)    No past surgical history on file.  reports that she has been smoking Cigarettes.  She started smoking about 12 years ago. She has been smoking about 1.00 pack per day. She does not have any smokeless tobacco history on file. She reports that she does not drink alcohol or use illicit drugs. family history includes Diabetes in her father. Allergies  Allergen Reactions  . Penicillins     Rash, hives     Review of Systems    Constitutional: Negative for fever and chills.  HENT: Positive for trouble swallowing. Negative for voice change.   Respiratory: Negative for cough and shortness of breath.   Cardiovascular: Negative for chest pain.  Gastrointestinal: Negative for abdominal pain.  Neurological: Negative for dizziness.  Psychiatric/Behavioral: The patient is nervous/anxious.        Objective:   Physical Exam  Constitutional: She appears well-developed and well-nourished. No distress.  HENT:  Mouth/Throat: Oropharynx is clear and moist.  Neck: Neck supple. No thyromegaly present.  Cardiovascular: Normal rate and regular rhythm.   Pulmonary/Chest: Effort normal and breath sounds normal. No respiratory distress. She has no wheezes. She has no rales.  Abdominal: Soft. Bowel sounds are normal. She exhibits no distension and no mass. There is no tenderness. There is no rebound and no guarding.  Lymphadenopathy:    She has no cervical adenopathy.          Assessment & Plan:  Dysphagia. She has very likely GERD and sensation of globus. Suspect she does have some underlying anxiety triggered by the above symptoms. Increase omeprazole to 40 mg twice daily until follow-up. Dietary precautions discussed. Avoid eating within 2-3 hours of bedtime. Elevate head of bed 6-8 inches. Routine follow-up in 3 weeks and follow-up sooner for any pain with swallowing, regurgitation, food impaction, or any progressive loss of appetite. Consider GI referral for endoscopy if not improving with the above. She has improved already some since last week

## 2014-12-03 NOTE — Progress Notes (Signed)
Pre visit review using our clinic review tool, if applicable. No additional management support is needed unless otherwise documented below in the visit note. 

## 2014-12-03 NOTE — Patient Instructions (Signed)
Food Choices for Gastroesophageal Reflux Disease When you have gastroesophageal reflux disease (GERD), the foods you eat and your eating habits are very important. Choosing the right foods can help ease the discomfort of GERD. WHAT GENERAL GUIDELINES DO I NEED TO FOLLOW?  Choose fruits, vegetables, whole grains, low-fat dairy products, and low-fat meat, fish, and poultry.  Limit fats such as oils, salad dressings, butter, nuts, and avocado.  Keep a food diary to identify foods that cause symptoms.  Avoid foods that cause reflux. These may be different for different people.  Eat frequent small meals instead of three large meals each day.  Eat your meals slowly, in a relaxed setting.  Limit fried foods.  Cook foods using methods other than frying.  Avoid drinking alcohol.  Avoid drinking large amounts of liquids with your meals.  Avoid bending over or lying down until 2-3 hours after eating. WHAT FOODS ARE NOT RECOMMENDED? The following are some foods and drinks that may worsen your symptoms: Vegetables Tomatoes. Tomato juice. Tomato and spaghetti sauce. Chili peppers. Onion and garlic. Horseradish. Fruits Oranges, grapefruit, and lemon (fruit and juice). Meats High-fat meats, fish, and poultry. This includes hot dogs, ribs, ham, sausage, salami, and bacon. Dairy Whole milk and chocolate milk. Sour cream. Cream. Butter. Ice cream. Cream cheese.  Beverages Coffee and tea, with or without caffeine. Carbonated beverages or energy drinks. Condiments Hot sauce. Barbecue sauce.  Sweets/Desserts Chocolate and cocoa. Donuts. Peppermint and spearmint. Fats and Oils High-fat foods, including [place] fries and potato chips. Other Vinegar. Strong spices, such as black pepper, white pepper, red pepper, cayenne, curry powder, cloves, ginger, and chili powder. The items listed above may not be a complete list of foods and beverages to avoid. Contact your dietitian for more  information. Document Released: 03/09/2005 Document Revised: 03/14/2013 Document Reviewed: 01/11/2013 [facility] Patient Information 2015 [place], [place]. This information is not intended to replace advice given to you by your health care provider. Make sure you discuss any questions you have with your health care provider.  Consider elevate head of bed 6 to 8 inches Avoid eating within 2-3 hours of bedtime Increase the Omeprazole to 40 mg twice daily until follow up  Follow up sooner for any vomiting, inability to swallow foods, increased pain with swallowing, or any progressive loss of appetite.

## 2014-12-14 ENCOUNTER — Telehealth: Payer: Self-pay | Admitting: Family Medicine

## 2014-12-14 MED ORDER — OMEPRAZOLE 20 MG PO CPDR: 20.0000 mg | DELAYED_RELEASE_CAPSULE | Freq: Two times a day (BID) | ORAL | Status: DC

## 2014-12-14 NOTE — Telephone Encounter (Signed)
Pt states dr Elease Hashimoto told her she could double up to 2/BID. Pt is now out of omeprazole (PRILOSEC) 20 MG capsule It will be less expensive if pt can get a 90 day sent to [facility] [address]

## 2014-12-14 NOTE — Telephone Encounter (Signed)
Rx sent 

## 2014-12-24 ENCOUNTER — Ambulatory Visit (INDEPENDENT_AMBULATORY_CARE_PROVIDER_SITE_OTHER): Payer: 59 | Admitting: Family Medicine

## 2014-12-24 ENCOUNTER — Encounter: Payer: Self-pay | Admitting: Family Medicine

## 2014-12-24 VITALS — BP 100/60 | HR 77 | Temp 98.3°F | Ht 66.0 in | Wt 156.6 lb

## 2014-12-24 DIAGNOSIS — K219 Gastro-esophageal reflux disease without esophagitis: Secondary | ICD-10-CM | POA: Diagnosis not present

## 2014-12-24 NOTE — Progress Notes (Signed)
   Subjective:    Patient ID: Alicia Wells, female    DOB: 1981-03-14, 34 y.o.   MRN: [id number]  HPI Patient seen for follow-up regarding recent GERD and sensation of "globus ". We increased her omeprazole to 40 mg twice daily and her symptoms have essentially resolved. She has no dysphagia at this time. No GERD symptoms. Appetite and weight are back up. She's gained almost 7 pounds since she was here several weeks ago. She did quit smoking recently. She has no pain or difficulties with swallowing whatsoever.  She does not consume alcohol. She has made some dietary modifications. Minimal caffeine consumption.  Past Medical History  Diagnosis Date  . Allergy   . Blood transfusion without reported diagnosis   . Depression   . Anxiety   . Headache(784.0)    No past surgical history on file.  reports that she has been smoking Cigarettes.  She started smoking about 12 years ago. She has been smoking about 1.00 pack per day. She does not have any smokeless tobacco history on file. She reports that she does not drink alcohol or use illicit drugs. family history includes Diabetes in her father. Allergies  Allergen Reactions  . Penicillins     Rash, hives      Review of Systems  Constitutional: Negative for appetite change and unexpected weight change.  HENT: Negative for trouble swallowing.   Respiratory: Negative for shortness of breath.   Cardiovascular: Negative for chest pain.  Gastrointestinal: Negative for nausea, vomiting and abdominal pain.  Neurological: Negative for dizziness.       Objective:   Physical Exam  Constitutional: She appears well-developed and well-nourished. No distress.  HENT:  Mouth/Throat: Oropharynx is clear and moist.  Cardiovascular: Normal rate and regular rhythm.   Pulmonary/Chest: Effort normal and breath sounds normal. No respiratory distress. She has no wheezes. She has no rales.  Abdominal: Soft. There is no tenderness.            Assessment & Plan:  GERD. Symptomatically improved. Try reducing omeprazole to 40 mg once daily. After one month if symptoms stable further reduce that to 20 mg once daily. We reviewed dietary modification. She is encouraged to stay away from nicotine use.

## 2014-12-24 NOTE — Progress Notes (Signed)
Pre visit review using our clinic review tool, if applicable. No additional management support is needed unless otherwise documented below in the visit note. 

## 2014-12-24 NOTE — Patient Instructions (Signed)
Try reducing the Omeprazole to 40 mg once daily for another month If doing well at that time may further reduce to 20 mg.

## 2015-01-28 ENCOUNTER — Ambulatory Visit (INDEPENDENT_AMBULATORY_CARE_PROVIDER_SITE_OTHER): Payer: 59 | Admitting: Family Medicine

## 2015-01-28 ENCOUNTER — Encounter: Payer: Self-pay | Admitting: Family Medicine

## 2015-01-28 VITALS — BP 100/80 | HR 80 | Temp 98.4°F | Resp 16 | Wt 159.0 lb

## 2015-01-28 DIAGNOSIS — J4521 Mild intermittent asthma with (acute) exacerbation: Secondary | ICD-10-CM | POA: Diagnosis not present

## 2015-01-28 MED ORDER — BECLOMETHASONE DIPROPIONATE 80 MCG/ACT IN AERS: 1.0000 | INHALATION_SPRAY | Freq: Two times a day (BID) | RESPIRATORY_TRACT | Status: DC

## 2015-01-28 MED ORDER — ALBUTEROL SULFATE HFA 108 (90 BASE) MCG/ACT IN AERS: 2.0000 | INHALATION_SPRAY | Freq: Four times a day (QID) | RESPIRATORY_TRACT | Status: DC | PRN

## 2015-01-28 NOTE — Progress Notes (Signed)
   Subjective:    Patient ID: Alicia Wells, female    DOB: 1980-10-04, 34 y.o.   MRN: [id number]  HPI Patient seen with wheezing. She's had some recent reflux in her dysphagia and GERD symptoms have been controlled with omeprazole. She has noticed wheezing almost nightly over the past couple weeks. Pro-air does help but very temporarily. She is not aware of any new allergen exposures. She thinks her symptoms are worse at home. No pets. Possible dust allergy. Quit smoking back in September.  Past Medical History  Diagnosis Date  . Allergy   . Blood transfusion without reported diagnosis   . Depression   . Anxiety   . Headache(784.0)    No past surgical history on file.  reports that she has been smoking Cigarettes.  She started smoking about 13 years ago. She has been smoking about 1.00 pack per day. She does not have any smokeless tobacco history on file. She reports that she does not drink alcohol or use illicit drugs. family history includes Diabetes in her father. Allergies  Allergen Reactions  . Penicillins     Rash, hives      Review of Systems  Constitutional: Negative for fever and chills.  Respiratory: Positive for shortness of breath and wheezing. Negative for cough.   Cardiovascular: Negative for chest pain.       Objective:   Physical Exam  Constitutional: She appears well-developed and well-nourished. No distress.  Neck: Neck supple.  Cardiovascular: Normal rate and regular rhythm.   Pulmonary/Chest: Effort normal. No respiratory distress. She has wheezes. She has no rales.  Musculoskeletal: She exhibits no edema.          Assessment & Plan:  Patient seen with some wheezing. She has history of mild intermittent asthma. Recently quit smoking. She has had some reflux with this has been fairly well controlled omeprazole. Given frequency of wheezing start Qvar 80 g 1 puff twice daily. Continue pro-air for as needed use. Instructions for rinsing mouth after  use of Qvar. Reassess one month

## 2015-01-28 NOTE — Patient Instructions (Signed)
Be sure to rinse your mouth after each use of the steroid inhaler.

## 2015-01-28 NOTE — Progress Notes (Signed)
Pre visit review using our clinic review tool, if applicable. No additional management support is needed unless otherwise documented below in the visit note. 

## 2015-01-31 ENCOUNTER — Ambulatory Visit (INDEPENDENT_AMBULATORY_CARE_PROVIDER_SITE_OTHER): Payer: 59 | Admitting: Gastroenterology

## 2015-01-31 ENCOUNTER — Encounter: Payer: Self-pay | Admitting: Gastroenterology

## 2015-01-31 VITALS — BP 102/70 | HR 76 | Ht 65.75 in | Wt 157.1 lb

## 2015-01-31 DIAGNOSIS — R1310 Dysphagia, unspecified: Secondary | ICD-10-CM

## 2015-01-31 DIAGNOSIS — R0989 Other specified symptoms and signs involving the circulatory and respiratory systems: Secondary | ICD-10-CM

## 2015-01-31 DIAGNOSIS — F458 Other somatoform disorders: Secondary | ICD-10-CM

## 2015-01-31 DIAGNOSIS — J4530 Mild persistent asthma, uncomplicated: Secondary | ICD-10-CM | POA: Diagnosis not present

## 2015-01-31 NOTE — Patient Instructions (Signed)

## 2015-01-31 NOTE — Progress Notes (Signed)
HPI :  34 y/o female seen in consultation for possible GERD and dysphagia from Dr. Carolann Littler  Patient reports she has a history of asthma as a child, and been treated for asthma recently, with concern that GERD could be contributing.  She has symptoms in her throat, symptoms of globus and dry throat. She endorses some occasional regurgitation but not routine. Otherwise she does not have much heartburn. She does endorse ongoing dysphagia. When she swallows she had dysphagia to solids to her upper chest, then passes. She reports this happens occasionally. No dysphagia to liquids. She states she has had this for the past month or so. She denies heartburn. She denies chest pains. No nausea or vomiting. No abdominal pains. She reports weight can fluctuate but no progressive loss. She has been on omeprazole 20mg  daily since September, and has increased to 40mg  BID, and then back to 20mg  BID. She thinks she may have had some benefit in her symptoms in her globus and dysphagia on this regimen. She is currently taking it once daily again.   She has had some ED visits for globus and shortness of breath. She reports the ER told her she had GERD which may be making her breathing worse. She is using albuterol and taking one puff a few times per day s which she thinks helps her symptoms. She has also been given Qvar for suspected asthma she reports earlier this week. She states he breathing has been improved on this regimen and currently denies shortness of breath or chest pains.   No FH of esophageal or gastric cancer.    Past Medical History  Diagnosis Date  . Allergy   . Blood transfusion without reported diagnosis   . Depression   . Anxiety   . Headache(784.0)   . Asthma     Past Surgical History  Procedure Laterality Date  . Wisdom tooth extraction     Family History  Problem Relation Age of Onset  . Diabetes Father    Social History  Substance Use Topics  . Smoking status: Former  Smoker -- 1.00 packs/day    Types: Cigarettes    Start date: 01/12/2002    Quit date: 10/31/2014  . Smokeless tobacco: Never Used  . Alcohol Use: No   Current Outpatient Prescriptions  Medication Sig Dispense Refill  . albuterol (PROAIR HFA) 108 (90 BASE) MCG/ACT inhaler Inhale 2 puffs into the lungs every 6 (six) hours as needed for wheezing or shortness of breath. 8.5 Inhaler 1  . beclomethasone (QVAR) 80 MCG/ACT inhaler Inhale 1 puff into the lungs 2 (two) times daily. 1 Inhaler 12  . LORazepam (ATIVAN) 0.5 MG tablet Take 1 tablet (0.5 mg total) by mouth 2 (two) times daily as needed for anxiety. (Patient taking differently: Take 0.25-0.5 mg by mouth 2 (two) times daily as needed for anxiety. ) 20 tablet 0  . Multiple Vitamins-Minerals (HAIR/SKIN/NAILS PO) Take 2 tablets by mouth daily.    [name] Kitchen omeprazole (PRILOSEC) 20 MG capsule Take 1 capsule (20 mg total) by mouth 2 (two) times daily before a meal. 180 capsule 3   No current facility-administered medications for this visit.   Allergies  Allergen Reactions  . Penicillins     Rash, hives     Review of Systems: All systems reviewed and negative except where noted in HPI.   Lab Results  Component Value Date   WBC 9.6 05/18/2014   HGB 12.9 11/28/2014   HCT 38.0 11/28/2014   MCV  84.4 05/18/2014   PLT 282.0 05/18/2014    Lab Results  Component Value Date   CREATININE 0.80 11/28/2014   BUN 13 11/28/2014   NA 141 11/28/2014   K 3.8 11/28/2014   CL 107 11/28/2014   CO2 31 12/19/2012    Lab Results  Component Value Date   ALT 19 12/19/2012   AST 28 12/19/2012   ALKPHOS 112 12/19/2012   BILITOT 0.6 12/19/2012     Physical Exam: BP 102/70 mmHg  Pulse 76  Ht 5' 5.75" (1.67 m)  Wt 157 lb 2 oz (71.271 kg)  BMI 25.56 kg/m2  LMP 01/23/2015 (Approximate) Constitutional: Pleasant,well-developed, female in no acute distress. HEENT: Normocephalic and atraumatic. Conjunctivae are normal. No scleral icterus. Neck supple.    Cardiovascular: Normal rate, regular rhythm.  Pulmonary/chest: Effort normal and breath sounds normal. No wheezing, rales or rhonchi. Abdominal: Soft, nondistended, nontender. Bowel sounds active throughout. There are no masses palpable. No hepatomegaly. Extremities: no edema Lymphadenopathy: No cervical adenopathy noted. Neurological: Alert and oriented to person place and time. Skin: Skin is warm and dry. No rashes noted. Psychiatric: Normal mood and affect. Behavior is normal.   ASSESSMENT AND PLAN: 34 y/o female presenting with globus sensation and dysphagia, with history of reported asthma. Now on steroid inhaler and albuterol routinely, breathing appears to be better on this regimen. It is possible she has GERD causing her globus and potentially making asthma worse, although she could also have eosinophilic driving her globus / dysphagia given her asthma history, or a motility disorder. Recommend an EGD at this time to evaluate dysphagia, rule out EoE, and assess for reflux. She can continue her omeprazole at present time.    The indications, risks, and benefits of EGD were explained to the patient in detail. Risks include but are not limited to bleeding, perforation, adverse reaction to medications, and cardiopulmonary compromise. Sequelae include but are not limited to the possibility of surgery, hospitalization, and mortality. The patient verbalized understanding and wished to proceed. All questions answered, referred to scheduler. Further recommendations pending results of the exam.   Burkburnett Cellar, MD [facility] Pager [phone]   CC: Dr. Carolann Littler

## 2015-02-01 ENCOUNTER — Encounter: Payer: Self-pay | Admitting: Gastroenterology

## 2015-02-01 ENCOUNTER — Ambulatory Visit (AMBULATORY_SURGERY_CENTER): Payer: 59 | Admitting: Gastroenterology

## 2015-02-01 VITALS — BP 117/78 | HR 71 | Temp 96.7°F | Resp 20 | Ht 65.0 in | Wt 157.0 lb

## 2015-02-01 DIAGNOSIS — Z9889 Other specified postprocedural states: Secondary | ICD-10-CM

## 2015-02-01 DIAGNOSIS — R1310 Dysphagia, unspecified: Secondary | ICD-10-CM

## 2015-02-01 HISTORY — DX: Other specified postprocedural states: Z98.890

## 2015-02-01 MED ORDER — SODIUM CHLORIDE 0.9 % IV SOLN: 500.0000 mL | INTRAVENOUS | Status: DC

## 2015-02-01 NOTE — Progress Notes (Signed)
Called to room to assist during endoscopic procedure.  Patient ID and intended procedure confirmed with present staff. Received instructions for my participation in the procedure from the performing physician.  

## 2015-02-01 NOTE — Op Note (Signed)
[place]  [address] [place], [id number]   ENDOSCOPY PROCEDURE REPORT  PATIENT: Alicia, Wells  MR#: [id number] BIRTHDATE: 04-Jun-1980 , 34  yrs. old GENDER: female ENDOSCOPIST: Yetta Flock, MD REFERRED BY: PROCEDURE DATE:  02/01/2015 PROCEDURE:  EGD w/ biopsy ASA CLASS:     Class II INDICATIONS:  dysphagia, globus, reflux, history of asthma, rule out EoE. MEDICATIONS: Propofol 230 mg IV TOPICAL ANESTHETIC:  DESCRIPTION OF PROCEDURE: After the risks benefits and alternatives of the procedure were thoroughly explained, informed consent was obtained.  The LB [id number] endoscope was introduced through the mouth and advanced to the second portion of the duodenum , Without limitations.  The instrument was slowly withdrawn as the mucosa was fully examined.  FINDINGS: The entire esophagus was normal.  There did not appear to be gross evidence of reflux esophagitis, eosinophilic esophagitis, and no evidence of stenosis or stricture.  Biopsies taken of the esophagus to rule out eosinophilic esophagitis.  The DH, GEJ, and SCJ located at 37cm from the incisors.  The stomach was normal, no ulcerations or inflammatory changes noted.  The duodenal bulb and 2nd portion of the duodenum were normal.  Retroflexed views revealed no abnormalities.     The scope was then withdrawn from the patient and the procedure completed.  COMPLICATIONS: There were no immediate complications.  ENDOSCOPIC IMPRESSION: Normal esophagus - biopsies taken to rule out eosinophilic esophagitis Normal stomach Normal duodenum  RECOMMENDATIONS: Await biopsy results Continue PPI if it helps symptoms Resume diet Resume medications Consider esophageal manometry and 24 HR pH impedance study to further evaluate symptoms if biopsies are unremarkable and symptoms persist  eSigned:  Yetta Flock, MD 02/01/2015 3:46 PM    CC:

## 2015-02-01 NOTE — Progress Notes (Signed)
Report to PACU, RN, vss, BBS= Clear.  

## 2015-02-01 NOTE — Patient Instructions (Addendum)
YOU HAD AN ENDOSCOPIC PROCEDURE TODAY AT [facility]:   Refer to the procedure report that was given to you for any specific questions about what was found during the examination.  If the procedure report does not answer your questions, please call your gastroenterologist to clarify.  If you requested that your care partner not be given the details of your procedure findings, then the procedure report has been included in a sealed envelope for you to review at your convenience later.  YOU SHOULD EXPECT: Some feelings of bloating in the abdomen. Passage of more gas than usual.  Walking can help get rid of the air that was put into your GI tract during the procedure and reduce the bloating. If you had a lower endoscopy (such as a colonoscopy or flexible sigmoidoscopy) you may notice spotting of blood in your stool or on the toilet paper. If you underwent a bowel prep for your procedure, you may not have a normal bowel movement for a few days.  Please Note:  You might notice some irritation and congestion in your nose or some drainage.  This is from the oxygen used during your procedure.  There is no need for concern and it should clear up in a day or so.  SYMPTOMS TO REPORT IMMEDIATELY:   Following upper endoscopy (EGD)  Vomiting of blood or coffee ground material  New chest pain or pain under the shoulder blades  Painful or persistently difficult swallowing  New shortness of breath  Fever of 100F or higher  Black, tarry-looking stools  For urgent or emergent issues, a gastroenterologist can be reached at any hour by calling [phone].   DIET: Your first meal following the procedure should be a small meal and then it is ok to progress to your normal diet. Heavy or fried foods are harder to digest and may make you feel nauseous or bloated.  Likewise, meals heavy in dairy and vegetables can increase bloating.  Drink plenty of fluids but you should avoid alcoholic beverages for  24 hours.  ACTIVITY:  You should plan to take it easy for the rest of today and you should NOT DRIVE or use heavy machinery until tomorrow (because of the sedation medicines used during the test).    FOLLOW UP: Our staff will call the number listed on your records the next business day following your procedure to check on you and address any questions or concerns that you may have regarding the information given to you following your procedure. If we do not reach you, we will leave a message.  However, if you are feeling well and you are not experiencing any problems, there is no need to return our call.  We will assume that you have returned to your regular daily activities without incident.  If any biopsies were taken you will be contacted by phone or by letter within the next 1-3 weeks.  Please call us at [phone] if you have not heard about the biopsies in 3 weeks.    SIGNATURES/CONFIDENTIALITY: You and/or your care partner have signed paperwork which will be entered into your electronic medical record.  These signatures attest to the fact that that the information above on your After Visit Summary has been reviewed and is understood.  Full responsibility of the confidentiality of this discharge information lies with you and/or your care-partner.  Await biopsy results. Note for work provided to patient/care partner.

## 2015-02-04 ENCOUNTER — Telehealth: Payer: Self-pay | Admitting: Gastroenterology

## 2015-02-04 NOTE — Telephone Encounter (Signed)
°  Follow up Call-  Call back number 02/01/2015  Post procedure Call Back phone  # [phone]  Permission to leave phone message Yes     Patient questions:  Do you have a fever, pain , or abdominal swelling? No. Pain Score  0 *  Have you tolerated food without any problems? Yes.    Have you been able to return to your normal activities? Yes.    Do you have any questions about your discharge instructions: Diet   No. Medications  No. Follow up visit  No.  Do you have questions or concerns about your Care? No.  Actions: * If pain score is 4 or above: No action needed, pain <4.

## 2015-02-06 ENCOUNTER — Ambulatory Visit: Payer: 59 | Admitting: Allergy and Immunology

## 2015-02-07 ENCOUNTER — Telehealth: Payer: Self-pay | Admitting: Family Medicine

## 2015-02-07 NOTE — Telephone Encounter (Signed)
Follow up with her please

## 2015-02-07 NOTE — Telephone Encounter (Signed)
[facility] - Client [place] [facility]  Patient Name: Alicia Wells  DOB: 1980/12/08    Initial Comment Caller states her omeprazole was causing her anxiety. She stopped taking this medication and now would like something called in for anxiety.   Nurse Assessment  Nurse: Luther Parody, RN, Malachy Mood Date/Time (Eastern Time): 02/07/2015 3:31:58 PM  Confirm and document reason for call. If symptomatic, describe symptoms. ---Caller states that she began having anxiety approx 4 wks ago and believes it may have been r/t the prilosec that she had been taking. She stopped it yesterday and is requesting something to take for her anxiety. Hx of taking prozac in the past.  Has the patient traveled out of the country within the last 30 days? ---Not Applicable  Does the patient have any new or worsening symptoms? ---Yes  Will a triage be completed? ---Yes  Related visit to physician within the last 2 weeks? ---Yes  Does the PT have any chronic conditions? (i.e. diabetes, asthma, etc.) ---Yes  List chronic conditions. ---acid reflux, anxiety  Did the patient indicate they were pregnant? ---No  Is this a behavioral health call? ---No     Guidelines    Guideline Title Affirmed Question Affirmed Notes  Anxiety and Panic Attack Symptoms interfere with work or school    Final Disposition User   See Physician within [place], RN, [employer]    Referrals  REFERRED TO PCP OFFICE   Disagree/Comply: Leta Baptist

## 2015-02-08 ENCOUNTER — Ambulatory Visit (INDEPENDENT_AMBULATORY_CARE_PROVIDER_SITE_OTHER): Payer: Self-pay | Admitting: Family Medicine

## 2015-02-08 ENCOUNTER — Ambulatory Visit: Payer: Self-pay | Admitting: Family Medicine

## 2015-02-08 DIAGNOSIS — R69 Illness, unspecified: Secondary | ICD-10-CM

## 2015-02-08 NOTE — Telephone Encounter (Signed)
Would not recommend chronic benzodiazepine use.  Start back Prozac 20 mg once daily and office follow up one month to reassess.

## 2015-02-08 NOTE — Telephone Encounter (Signed)
Left message on machine for patient to return our call 

## 2015-02-08 NOTE — Progress Notes (Signed)
No show

## 2015-02-09 ENCOUNTER — Ambulatory Visit (INDEPENDENT_AMBULATORY_CARE_PROVIDER_SITE_OTHER): Payer: 59 | Admitting: Family Medicine

## 2015-02-09 VITALS — BP 118/78 | HR 80 | Temp 98.4°F | Resp 16 | Ht 67.0 in | Wt 152.0 lb

## 2015-02-09 DIAGNOSIS — G4700 Insomnia, unspecified: Secondary | ICD-10-CM

## 2015-02-09 DIAGNOSIS — R0602 Shortness of breath: Secondary | ICD-10-CM

## 2015-02-09 DIAGNOSIS — J4540 Moderate persistent asthma, uncomplicated: Secondary | ICD-10-CM

## 2015-02-09 DIAGNOSIS — F411 Generalized anxiety disorder: Secondary | ICD-10-CM | POA: Diagnosis not present

## 2015-02-09 DIAGNOSIS — F410 Panic disorder [episodic paroxysmal anxiety] without agoraphobia: Secondary | ICD-10-CM | POA: Diagnosis not present

## 2015-02-09 LAB — POCT URINALYSIS DIP (MANUAL ENTRY)
BILIRUBIN UA: NEGATIVE
GLUCOSE UA: NEGATIVE
LEUKOCYTES UA: NEGATIVE
Nitrite, UA: NEGATIVE
Protein Ur, POC: NEGATIVE
SPEC GRAV UA: 1.015
Urobilinogen, UA: 0.2
pH, UA: 6.5

## 2015-02-09 LAB — POCT CBC
Granulocyte percent: 67.4 %G (ref 37–80)
HCT, POC: 34.7 % — AB (ref 37.7–47.9)
Hemoglobin: 11.1 g/dL — AB (ref 12.2–16.2)
LYMPH, POC: 2 (ref 0.6–3.4)
MCH, POC: 25.7 pg — AB (ref 27–31.2)
MCHC: 32.1 g/dL (ref 31.8–35.4)
MCV: 80 fL (ref 80–97)
MID (cbc): 0.3 (ref 0–0.9)
MPV: 7 fL (ref 0–99.8)
POC Granulocyte: 4.7 (ref 2–6.9)
POC LYMPH PERCENT: 28.8 %L (ref 10–50)
POC MID %: 3.8 % (ref 0–12)
Platelet Count, POC: 286 10*3/uL (ref 142–424)
RBC: 4.34 M/uL (ref 4.04–5.48)
RDW, POC: 17.1 %
WBC: 7 10*3/uL (ref 4.6–10.2)

## 2015-02-09 MED ORDER — CLONAZEPAM 0.5 MG PO TABS: 0.2500 mg | ORAL_TABLET | Freq: Every day | ORAL | Status: DC | PRN

## 2015-02-09 MED ORDER — FLUOXETINE HCL 20 MG PO TABS: 20.0000 mg | ORAL_TABLET | Freq: Every day | ORAL | Status: DC

## 2015-02-09 NOTE — Patient Instructions (Signed)
Generalized Anxiety Disorder Generalized anxiety disorder (GAD) is a mental disorder. It interferes with life functions, including relationships, work, and school. GAD is different from normal anxiety, which everyone experiences at some point in their lives in response to specific life events and activities. Normal anxiety actually helps us prepare for and get through these life events and activities. Normal anxiety goes away after the event or activity is over.  GAD causes anxiety that is not necessarily related to specific events or activities. It also causes excess anxiety in proportion to specific events or activities. The anxiety associated with GAD is also difficult to control. GAD can vary from mild to severe. People with severe GAD can have intense waves of anxiety with physical symptoms (panic attacks).  SYMPTOMS The anxiety and worry associated with GAD are difficult to control. This anxiety and worry are related to many life events and activities and also occur more days than not for 6 months or longer. People with GAD also have three or more of the following symptoms (one or more in children):  Restlessness.   Fatigue.  Difficulty concentrating.   Irritability.  Muscle tension.  Difficulty sleeping or unsatisfying sleep. DIAGNOSIS GAD is diagnosed through an assessment by your health care provider. Your health care provider will ask you questions aboutyour mood,physical symptoms, and events in your life. Your health care provider may ask you about your medical history and use of alcohol or drugs, including prescription medicines. Your health care provider may also do a physical exam and blood tests. Certain medical conditions and the use of certain substances can cause symptoms similar to those associated with GAD. Your health care provider may refer you to a mental health specialist for further evaluation. TREATMENT The following therapies are usually used to treat GAD:    Medication. Antidepressant medication usually is prescribed for long-term daily control. Antianxiety medicines may be added in severe cases, especially when panic attacks occur.   Talk therapy (psychotherapy). Certain types of talk therapy can be helpful in treating GAD by providing support, education, and guidance. A form of talk therapy called cognitive behavioral therapy can teach you healthy ways to think about and react to daily life events and activities.  Stress managementtechniques. These include yoga, meditation, and exercise and can be very helpful when they are practiced regularly. A mental health specialist can help determine which treatment is best for you. Some people see improvement with one therapy. However, other people require a combination of therapies.   This information is not intended to replace advice given to you by your health care provider. Make sure you discuss any questions you have with your health care provider.   Document Released: 07/04/2012 Document Revised: 03/30/2014 Document Reviewed: 07/04/2012 Elsevier Interactive Patient Education 2016 Elsevier Inc.  

## 2015-02-09 NOTE — Progress Notes (Addendum)
Subjective:    Patient ID: Alicia Wells, female    DOB: 01/07/1981, 34 y.o.   MRN: [id number]  02/09/2015  Shortness of Breath   HPI This 34 y.o. female presents for evaluation of possible of anxiety. Feeling anxious and emotional with fear; wary; stress. No depression.  Stressors at home and work.  Mom and pt not getting along well.  Job is stressful due to gossip at work.  Worried about what people are saying at work.  One person at job; has crush on for past several months; gets anxious about seeing him.  Years ago, suffered with anxiety.  Suffers with SOB when gets really anxious.  When laying down, mind races; then starts having to take a deep breath.  Sleeping on couch; sleeping sitting up.  Not sleeping fine.  Scared going to die if falls asleep.   +chest pain with SOB; heart fluttering.   Has been using inhaler due to increasing SOB.  Shaky as well.  Stopped caffeine after GERD. Stopped smoking 3 months ago.  Using Albuterol qid for past three months with minimal relief.  Decrease appetite with some weight loss.  Took Prozac in past for depression; no side effects; underwent counseling in past with good results.    ED visit in 11/2014 due to throat tightness and SOB.  After ED, pt scheduled appointment with GI specialist; underwent EGD last week; all normal.  No medications; was taking Omeprazole which seemed to make anxiety worse.  Stopped Omeprazole three days ago.  Throat tightness did not improve with Omerprazole. S/p CXR in ED; WNL. S/p EKG in ED: WNL.   Review of Systems  Constitutional: Negative for fever, chills, diaphoresis and fatigue.  HENT: Negative for congestion, facial swelling, postnasal drip, rhinorrhea, sore throat and voice change.   Eyes: Negative for visual disturbance.  Respiratory: Positive for shortness of breath. Negative for cough.   Cardiovascular: Positive for chest pain and palpitations. Negative for leg swelling.  Gastrointestinal: Negative for nausea,  vomiting, abdominal pain, diarrhea and constipation.  Endocrine: Negative for cold intolerance, heat intolerance, polydipsia, polyphagia and polyuria.  Neurological: Negative for dizziness, tremors, seizures, syncope, facial asymmetry, speech difficulty, weakness, light-headedness, numbness and headaches.  Psychiatric/Behavioral: Positive for sleep disturbance. Negative for suicidal ideas and self-injury. The patient is nervous/anxious.     Past Medical History  Diagnosis Date  . Allergy   . Blood transfusion without reported diagnosis   . Depression   . Anxiety   . Headache(784.0)   . Asthma    Past Surgical History  Procedure Laterality Date  . Wisdom tooth extraction     Allergies  Allergen Reactions  . Penicillins     Rash, hives    Social History   Social History  . Marital Status: Single    Spouse Name: N/A  . Number of Children: N/A  . Years of Education: N/A   Occupational History  . employed     post office clerk   Social History Main Topics  . Smoking status: Former Smoker -- 1.00 packs/day    Types: Cigarettes    Start date: 01/12/2002    Quit date: 10/31/2014  . Smokeless tobacco: Never Used  . Alcohol Use: No  . Drug Use: No  . Sexual Activity: Not on file   Other Topics Concern  . 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 Diabetes Father        Objective:    BP 118/78 mmHg  Pulse 80  Temp(Src) 98.4 F (36.9 C) (Oral)  Resp 16  Ht 5\' 7"  (1.702 m)  Wt 152 lb (68.947 kg)  BMI 23.80 kg/m2  SpO2 98%  LMP 01/23/2015 (Approximate) Physical Exam  Constitutional: She is oriented to person, place, and time. She appears well-developed and well-nourished. No distress.  HENT:  Head:  Normocephalic and atraumatic.  Right Ear: External ear normal.  Left Ear: External ear normal.  Nose: Nose normal.  Mouth/Throat: Oropharynx is clear and moist.  Eyes: Conjunctivae and EOM are normal. Pupils are equal, round, and reactive to light.  Neck: Normal range of motion. Neck supple. Carotid bruit is not present. No thyromegaly present.  Cardiovascular: Normal rate, regular rhythm, normal heart sounds and intact distal pulses.  Exam reveals no gallop and no friction rub.   No murmur heard. Pulmonary/Chest: Effort normal and breath sounds normal. She has no wheezes. She has no rales.  Abdominal: Soft. Bowel sounds are normal. She exhibits no distension and no mass. There is no tenderness. There is no rebound and no guarding.  Lymphadenopathy:    She has no cervical adenopathy.  Neurological: She is alert and oriented to person, place, and time. No cranial nerve deficit. She exhibits normal muscle tone. Coordination normal.  B hand tremor with extension of arms.  Skin: Skin is warm and dry. No rash noted. She is not diaphoretic. No erythema. No pallor.  Psychiatric: She has a normal mood and affect. Her behavior is normal.   Results for orders placed or performed in visit on 02/09/15  POCT CBC  Result Value Ref Range   WBC 7.0 4.6 - 10.2 K/uL   Lymph, poc 2.0 0.6 - 3.4   POC LYMPH PERCENT 28.8 10 - 50 %L   MID (cbc) 0.3 0 - 0.9   POC MID % 3.8 0 - 12 %M   POC Granulocyte 4.7 2 - 6.9   Granulocyte percent 67.4 37 - 80 %G   RBC 4.34 4.04 - 5.48 M/uL   Hemoglobin 11.1 (A) 12.2 - 16.2 g/dL   HCT, POC 34.7 (A) 37.7 - 47.9 %   MCV 80.0 80 - 97 fL   MCH, POC 25.7 (A) 27 - 31.2 pg   MCHC 32.1 31.8 - 35.4 g/dL   RDW, POC 17.1 %   Platelet Count, POC 286 142 - 424 K/uL   MPV 7.0 0 - 99.8 fL  POCT urinalysis dipstick  Result Value Ref Range   Color, UA yellow yellow   Clarity, UA clear clear   Glucose, UA negative negative   Bilirubin, UA negative negative   Ketones, POC UA  small (15) (A) negative   Spec Grav, UA 1.015    Blood, UA trace-lysed (A) negative   pH, UA 6.5    Protein Ur, POC negative negative   Urobilinogen, UA 0.2    Nitrite, UA Negative Negative   Leukocytes, UA Negative Negative       Assessment & Plan:   1. Generalized anxiety disorder   2. Panic attack   3. Insomnia   4. Shortness of  breath   5. Asthma, moderate persistent, uncomplicated     1. Generalized anxiety disorder: New. Onset three months ago; family and work stressors.  rx for Prozac 20mg  daily as recommended by Dr. Graciela Husbands on 02/08/15; change Lorazepam to Clonazepam 0.5mg  1/2 -1 daily PRN; will help with anxiety and insomnia.  Caffeine avoidance emphasized; encourage daily exercise for stress management.  Patient to consider psychotherapy. 2. Panic attacks:  New.  S/p CXR and EKG in ED 11/2014; both WNL; obtain labs; treat anxiety and expect panic attacks to improve. 3.  Insomnia: New. Secondary to uncontrolled anxiety; rx for Clonazepam.  Encourage exercise and caffeine avoidance. 4.  SOB: New.  Secondary to anxiety.  Obtain labs to rule out secondary causes; minimal improvement with albuterol. 5. Asthma: stable without recent exacerbations; not etiology to SOB.  Normal peak flow of 525 in office.   Orders Placed This Encounter  Procedures  . Comprehensive metabolic panel  . TSH  . Vitamin B12  . VITAMIN D 25 Hydroxy (Vit-D Deficiency, Fractures)  . POCT CBC  . POCT urinalysis dipstick   Meds ordered this encounter  Medications  . FLUoxetine (PROZAC) 20 MG tablet    Sig: Take 1 tablet (20 mg total) by mouth daily.    Dispense:  30 tablet    Refill:  3  . clonazePAM (KLONOPIN) 0.5 MG tablet    Sig: Take 0.5-1 tablets (0.25-0.5 mg total) by mouth daily as needed for anxiety.    Dispense:  30 tablet    Refill:  1    No Follow-up on file.    Gisela Lea Elayne Guerin, M.D. [facility] [address] [place], [place]   [phone] phone [phone] fax

## 2015-02-10 LAB — COMPREHENSIVE METABOLIC PANEL
ALT: 7 U/L (ref 6–29)
AST: 16 U/L (ref 10–30)
Albumin: 4.1 g/dL (ref 3.6–5.1)
Alkaline Phosphatase: 98 U/L (ref 33–115)
BUN: 10 mg/dL (ref 7–25)
CO2: 26 mmol/L (ref 20–31)
Calcium: 9.6 mg/dL (ref 8.6–10.2)
Chloride: 105 mmol/L (ref 98–110)
Creat: 0.86 mg/dL (ref 0.50–1.10)
Glucose, Bld: 97 mg/dL (ref 65–99)
Potassium: 3.9 mmol/L (ref 3.5–5.3)
Sodium: 138 mmol/L (ref 135–146)
Total Bilirubin: 0.5 mg/dL (ref 0.2–1.2)
Total Protein: 7.3 g/dL (ref 6.1–8.1)

## 2015-02-10 LAB — VITAMIN B12: Vitamin B-12: 954 pg/mL — ABNORMAL HIGH (ref 211–911)

## 2015-02-10 LAB — TSH: TSH: 0.934 u[IU]/mL (ref 0.350–4.500)

## 2015-02-11 ENCOUNTER — Telehealth: Payer: Self-pay

## 2015-02-11 LAB — VITAMIN D 25 HYDROXY (VIT D DEFICIENCY, FRACTURES): VIT D 25 HYDROXY: 16 ng/mL — AB (ref 30–100)

## 2015-02-11 NOTE — Telephone Encounter (Signed)
Call -- WHEN is patient taking Prozac?  Is she taking it with food or on an empty stomach?

## 2015-02-11 NOTE — Telephone Encounter (Signed)
She feels the prozac is causing her heartburn.

## 2015-02-11 NOTE — Telephone Encounter (Signed)
Pt states the medication she was given is causing her to have heartburn and would like to change to something else. Please call [facility]

## 2015-02-12 MED ORDER — FLUOXETINE HCL 20 MG PO TABS: 20.0000 mg | ORAL_TABLET | Freq: Every day | ORAL | Status: DC

## 2015-02-12 NOTE — Addendum Note (Signed)
Addended by: Elio Forget on: 02/12/2015 09:06 AM   Modules accepted: Orders

## 2015-02-12 NOTE — Telephone Encounter (Signed)
Please see previous message Unable to reach Pt. Left VM to call back.

## 2015-02-12 NOTE — Telephone Encounter (Signed)
Pt states that her fluoxitine is in a capsule form. I have resend in medication as tablets. She has a pending appt 02/27/15.

## 2015-02-13 ENCOUNTER — Telehealth: Payer: Self-pay | Admitting: Family Medicine

## 2015-02-13 MED ORDER — ONDANSETRON 8 MG PO TBDP: 8.0000 mg | ORAL_TABLET | Freq: Three times a day (TID) | ORAL | Status: DC | PRN

## 2015-02-13 NOTE — Telephone Encounter (Signed)
Alicia Wells, a RN from the [facility] call center called saying Alicia Wells spoke to her saying she's having a bad reaction to the Prozak that was prescribed by Urgent Care. Vomiting began today but she's also experiencing severe reflux. She refused going to the ER as suggested by the RN and wants a different type of medication sent to her pharmacy. Please give Alicia Wells a phone call.  Pt ph# [phone] Thank you.

## 2015-02-13 NOTE — Telephone Encounter (Signed)
Dr. Tamala Julian  Please see your previous message  I spoke with the Pt. And she stated that she is taking the Prozac in the afternoon around 2 pm. She said she was opening the capsules and putting it in apple sauce and eating it that way and it still caused her to have heart burn. She also wanted you to know that she vomited today because of her upset stomach. She also wanted to know if she can have something that won't make her sleepy.

## 2015-02-13 NOTE — Telephone Encounter (Signed)
This was handled by Asencion Partridge in another phone note.

## 2015-02-13 NOTE — Telephone Encounter (Signed)
Pt refused ED.  We instructed her to STOP the Prozac.  Zofran 8 mg po q 8 hours prn N/V.  Continue with PPI.

## 2015-02-13 NOTE — Telephone Encounter (Signed)
Spoke to pt and shes not taking antidepressant. She start the [facility] on Saturday and been having nauseas, vomiting and diarrhea. Told pt if she getting worse to go to the ED. Called in for [facility] 8mg 

## 2015-02-13 NOTE — Telephone Encounter (Signed)
[facility] - Client [place] [facility] Center  Patient Name: Alicia Wells  DOB: [birth year]    Initial Comment Caller states that medication is making her vomit and have heart burn.    Nurse Assessment  Nurse: Genoveva Ill, RN, Lattie Haw Date/Time (Eastern Time): 02/13/2015 3:43:07 PM  Confirm and document reason for call. If symptomatic, describe symptoms. ---Caller states was seen at [facility] Sat and rx given for prozac d/t anxiety; has vomited 4-5x today and has bad heart burn and nausea  Has the patient traveled out of the country within the last 30 days? ---Not Applicable  Does the patient have any new or worsening symptoms? ---Yes  Will a triage be completed? ---Yes  Related visit to physician within the last 2 weeks? ---Yes  Does the PT have any chronic conditions? (i.e. diabetes, asthma, etc.) ---No  Did the patient indicate they were pregnant? ---No  Is this a behavioral health call? ---No     Guidelines    Guideline Title Affirmed Question Affirmed Notes  Chest Pain SEVERE chest pain    Final Disposition User   Go to ED Now [place], RN, Lattie Haw    Comments  caller does not want to go to ER and just wants the med changed; states her PCP called her about it a few days ago  Advised caller that someone would be calling her back; called back line at office and reported to [place] REFUSED   Disagree/Comply: Disagree  Disagree/Comply Reason: Disagree with instructions

## 2015-02-15 ENCOUNTER — Ambulatory Visit (INDEPENDENT_AMBULATORY_CARE_PROVIDER_SITE_OTHER): Payer: 59 | Admitting: Internal Medicine

## 2015-02-15 ENCOUNTER — Encounter: Payer: Self-pay | Admitting: Internal Medicine

## 2015-02-15 ENCOUNTER — Other Ambulatory Visit (INDEPENDENT_AMBULATORY_CARE_PROVIDER_SITE_OTHER): Payer: 59

## 2015-02-15 VITALS — BP 108/70 | HR 85 | Ht 67.0 in | Wt 145.0 lb

## 2015-02-15 DIAGNOSIS — J4540 Moderate persistent asthma, uncomplicated: Secondary | ICD-10-CM

## 2015-02-15 DIAGNOSIS — R0600 Dyspnea, unspecified: Secondary | ICD-10-CM

## 2015-02-15 LAB — CBC WITH DIFFERENTIAL/PLATELET
BASOS ABS: 0.1 10*3/uL (ref 0.0–0.1)
Basophils Relative: 0.9 % (ref 0.0–3.0)
EOS ABS: 0.3 10*3/uL (ref 0.0–0.7)
Eosinophils Relative: 2.7 % (ref 0.0–5.0)
HEMATOCRIT: 39.8 % (ref 36.0–46.0)
Hemoglobin: 12.9 g/dL (ref 12.0–15.0)
LYMPHS PCT: 18.1 % (ref 12.0–46.0)
Lymphs Abs: 2 10*3/uL (ref 0.7–4.0)
MCHC: 32.4 g/dL (ref 30.0–36.0)
MCV: 79.4 fl (ref 78.0–100.0)
MONO ABS: 0.8 10*3/uL (ref 0.1–1.0)
Monocytes Relative: 7.3 % (ref 3.0–12.0)
NEUTROS ABS: 8 10*3/uL — AB (ref 1.4–7.7)
NEUTROS PCT: 71 % (ref 43.0–77.0)
PLATELETS: 331 10*3/uL (ref 150.0–400.0)
RBC: 5.02 Mil/uL (ref 3.87–5.11)
RDW: 17.5 % — ABNORMAL HIGH (ref 11.5–15.5)
WBC: 11.2 10*3/uL — ABNORMAL HIGH (ref 4.0–10.5)

## 2015-02-15 LAB — D-DIMER, QUANTITATIVE: D-Dimer, Quant: 0.44 ug/mL-FEU (ref 0.00–0.48)

## 2015-02-15 LAB — BRAIN NATRIURETIC PEPTIDE: PRO B NATRI PEPTIDE: 10 pg/mL (ref 0.0–100.0)

## 2015-02-15 MED ORDER — SERTRALINE HCL 50 MG PO TABS: 50.0000 mg | ORAL_TABLET | Freq: Every day | ORAL | Status: DC

## 2015-02-15 NOTE — Progress Notes (Signed)
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 Pulmonary Consult    Pt self referred for SOB with activity and rest x 3 months. Pt states she is using her albuterol hfa 5 times per day. Pt denies CP/tightness, cough. Pt states at times she feels palpitations and then she feels SOB.   Baseline was occ waking up summer of 2016 and needing saba but much worse since Sept 2016  Having spells at hs 4-5 hours p asleep  3/7 nights on omperazole at hs s overt hb   No obvious  patterns in day to day or daytime variabilty or assoc chronic cough or cp or chest tightness, subjective wheeze overt sinus or hb symptoms. No unusual exp hx or h/o childhood pna/ asthma or knowledge of premature birth.  Sleeping ok without nocturnal  or early am exacerbation  of respiratory  c/o's or need for noct saba. Also denies any obvious fluctuation of symptoms with weather or environmental changes or other aggravating or alleviating factors except as outlined above   Current Medications, Allergies, Complete Past Medical History, Past Surgical History, Family History, and Social History were reviewed in [employer] record.               Review of Systems  Constitutional: Negative for fever and unexpected weight change.  HENT: Negative for congestion, dental problem, ear pain, nosebleeds, postnasal drip, rhinorrhea, sinus  pressure, sneezing, sore throat and trouble swallowing.   Eyes: Negative for redness and itching.  Respiratory: Positive for shortness of breath. Negative for cough, chest tightness and wheezing.   Cardiovascular: Negative for palpitations and leg swelling.  Gastrointestinal: Negative for nausea and vomiting.  Genitourinary: Negative for dysuria.  Musculoskeletal: Negative for joint swelling.  Skin: Negative for rash.  Neurological: Negative for headaches.  Hematological: Does not bruise/bleed easily.  Psychiatric/Behavioral: Negative for dysphoric mood. The patient is not nervous/anxious.        Objective:   Physical Exam  Amb bf nad   Wt Readings from Last 3 Encounters:  02/15/15 145 lb (65.772 kg)  02/09/15 152 lb (68.947 kg)  02/01/15 157 lb (71.215 kg)    Vital signs reviewed   HEENT: nl dentition, turbinates, and oropharynx. Nl external ear canals without cough reflex   NECK :  without JVD/Nodes/TM/ nl carotid upstrokes bilaterally   LUNGS: no acc muscle use, clear to A and P bilaterally without cough on insp or exp maneuvers   CV:  RRR  no s3 or murmur or increase in P2, no edema   ABD:  soft and nontender with nl excursion in the supine position. No bruits or organomegaly, bowel sounds nl  MS:  warm without deformities, calf tenderness, cyanosis or clubbing  SKIN: warm and dry without lesions  NEURO:  alert, approp, no deficits       I personally reviewed images and agree with radiology impression as follows:  CXR:   11/28/14 No active cardiopulmonary disease.    Labs ordered/ reviewed:      Chemistry      Component Value Date/Time   NA 138 02/09/2015 1531   K 3.9 02/09/2015 1531   CL 105 02/09/2015 1531   CO2 26 02/09/2015 1531   BUN 10 02/09/2015 1531   CREATININE 0.86 02/09/2015 1531   CREATININE 0.80 11/28/2014 0801      Component Value Date/Time   CALCIUM 9.6 02/09/2015 1531   ALKPHOS 98 02/09/2015 1531   AST 16 02/09/2015 1531    ALT 7 02/09/2015 1531   BILITOT 0.5 02/09/2015 1531        Lab Results  Component Value Date   WBC 11.2* 02/15/2015   HGB 12.9 02/15/2015   HCT 39.8 02/15/2015   MCV 79.4 02/15/2015   PLT 331.0 02/15/2015     Lab Results  Component Value Date   DDIMER 0.44 02/15/2015      Lab Results  Component Value Date   TSH 0.934 02/09/2015     Lab Results  Component Value Date   PROBNP 10.0 02/15/2015              Assessment & Plan:

## 2015-02-15 NOTE — Telephone Encounter (Signed)
Call -- -I have sent in Zoloft 50mg  one tablet daily for anxiety; this will replace the Prozac.  I see that she developed vomiting and diarrhea over the weekend.  How is she feeling today?

## 2015-02-15 NOTE — Patient Instructions (Addendum)
ompeprazole should be taken Take 30- 60 min before your first and last meals of the day   At bedtime try pepcid ac 20 mg and chlorpheniramine 4 mg at bedtime (both are over the counter)  Only use your albuterol as a rescue medication to be used if you can't catch your breath by resting or doing a relaxed purse lip breathing pattern.  - The less you use it, the better it will work when you need it. - Ok to use up to 2 puffs  every 4 hours if you must but call for immediate appointment if use goes up over your usual need - Don't leave home without it !!  (think of it like the spare tire for your car)   GERD (REFLUX)  is an extremely common cause of respiratory symptoms just like yours , many times with no obvious heartburn at all.    It can be treated with medication, but also with lifestyle changes including elevation of the head of your bed (ideally with 6 inch  bed blocks),  Smoking cessation, avoidance of late meals, excessive alcohol, and avoid fatty foods, chocolate, peppermint, colas, red wine, and acidic juices such as orange juice.  NO MINT OR MENTHOL PRODUCTS SO NO COUGH DROPS  USE SUGARLESS CANDY INSTEAD (Jolley ranchers or Stover's or Life Savers) or even ice chips will also do - the key is to swallow to prevent all throat clearing. NO OIL BASED VITAMINS - use powdered substitutes.  Please remember to go to the lab  department downstairs for your tests - we will call you with the results when they are available.    Please schedule a follow up office visit in 6 weeks, call sooner if needed with pfts on return  Late add also needs repeat walking sats

## 2015-02-15 NOTE — Telephone Encounter (Signed)
Spoke with pt, advised message from Dr. Tamala Julian. Pt understood. SHe states she is feeling better.

## 2015-02-16 ENCOUNTER — Encounter: Payer: Self-pay | Admitting: Internal Medicine

## 2015-02-16 NOTE — Assessment & Plan Note (Addendum)
02/15/2015  Walked RA x 3 laps @ 185 ft each stopped due to  Mild sob, sats  89% at rapid pace, no increase in pulse on 3 rd lap when symptoms and drop sat occurred so suspect spurious readings - Spirometry 02/15/2015 = wnl including fef 25-75 when perceived need for q 6 h saba   Symptoms are markedly disproportionate to objective findings and not clear this is a lung problem but pt does appear to have difficult airway management issues. DDX of  difficult airways management all start with A and  include Adherence, Ace Inhibitors, Acid Reflux, Active Sinus Disease, Alpha 1 Antitripsin deficiency, Anxiety masquerading as Airways dz,  ABPA,  allergy(esp in young), Aspiration (esp in elderly), Adverse effects of meds,  Active smokers, A bunch of PE's (a small clot burden can't cause this syndrome unless there is already severe underlying pulm or vascular dz with poor reserve) plus two Bs  = Bronchiectasis and Beta blocker use..and one C= CHF   ? Acid (or non-acid) GERD > always difficult to exclude as up to 75% of pts in some series report no assoc GI/ Heartburn symptoms> rec max (24h)  acid suppression and diet restrictions/ reviewed and instructions given in writing.   ? Anxiety > usually at the bottom of this list of usual suspects but should be much higher on this pt's based on H and P and note already on psychotropics .   ? A bunch of PE's > D dimer nl - while  A nl valute  may miss a small peripheral pe, the clot burden with sob is moderately high and the d dimer has a very high neg pred value in this setting   ? Allergy/ asthma > much less likely in absence of any cough or obvious atopic symptoms but will screen for allergy to be complete   Plan will be to rx aggressively for GERD then return for full pfts and consider methacholine challenge testing/ cpst if dx in doubt   I had an extended discussion with the patient reviewing all relevant studies completed to date and  lasting 35 minutes of a 60  minute visit    Each maintenance medication was reviewed in detail including most importantly the difference between maintenance and prns and under what circumstances the prns are to be triggered using an action plan format that is not reflected in the computer generated alphabetically organized AVS.    Please see instructions for details which were reviewed in writing and the patient given a copy highlighting the part that I personally wrote and discussed at today's ov.

## 2015-02-16 NOTE — Assessment & Plan Note (Signed)
02/15/2015  extensive coaching HFA effectiveness =    0  This plus a nl spirometry today argue strongly against poorly controlled asthma and if favor of one of the masqueraders of asthma > see dyspnea ddx

## 2015-02-18 LAB — ALLERGY FULL PROFILE
ALTERNARIA ALTERNATA: 6.5 kU/L — AB
Allergen, D pternoyssinus,d7: 0.13 kU/L — ABNORMAL HIGH
Allergen,Goose feathers, e70: 0.12 kU/L — ABNORMAL HIGH
Aspergillus fumigatus, m3: 3.43 kU/L — ABNORMAL HIGH
BAHIA GRASS: 0.99 kU/L — AB
BERMUDA GRASS: 0.55 kU/L — AB
Box Elder IgE: 0.13 kU/L — ABNORMAL HIGH
CAT DANDER: 34 kU/L — AB
CURVULARIA LUNATA: 6.7 kU/L — AB
Candida Albicans: 1.43 kU/L — ABNORMAL HIGH
Common Ragweed: <0.1 kU/L
D. farinae: 0.19 kU/L — ABNORMAL HIGH
DOG DANDER: 5.21 kU/L — AB
ELM IGE: 1.32 kU/L — AB
FESCUE: 6.57 kU/L — AB
G005 Rye, Perennial: 6.89 kU/L — ABNORMAL HIGH
G009 RED TOP: 7.54 kU/L — AB
Goldenrod: 0.36 kU/L — ABNORMAL HIGH
HOUSE DUST HOLLISTER: 3.11 kU/L — AB
Helminthosporium halodes: 8.64 kU/L — ABNORMAL HIGH
IgE (Immunoglobulin E), Serum: 1255 kU/L — ABNORMAL HIGH (ref <115)
Lamb's Quarters: 0.74 kU/L — ABNORMAL HIGH
OAK CLASS: 0.15 kU/L — AB
PLANTAIN: 0.41 kU/L — AB
Stemphylium Botryosum: 8.57 kU/L — ABNORMAL HIGH
Sycamore Tree: 0.26 kU/L — ABNORMAL HIGH
Timothy Grass: 5.39 kU/L — ABNORMAL HIGH

## 2015-02-18 NOTE — Progress Notes (Signed)
Quick Note:  Contacted pt with results per MW Pt expressed understanding, nothing further needed ______ 

## 2015-02-26 MED ORDER — VITAMIN D (ERGOCALCIFEROL) 1.25 MG (50000 UNIT) PO CAPS: 50000.0000 [IU] | ORAL_CAPSULE | ORAL | Status: DC

## 2015-02-26 NOTE — Addendum Note (Signed)
Addended by: Wardell Honour on: 02/26/2015 09:38 AM   Modules accepted: Orders

## 2015-02-27 ENCOUNTER — Ambulatory Visit: Payer: 59 | Admitting: Family Medicine

## 2015-03-29 ENCOUNTER — Ambulatory Visit: Payer: 59 | Admitting: Internal Medicine

## 2015-06-13 ENCOUNTER — Ambulatory Visit (INDEPENDENT_AMBULATORY_CARE_PROVIDER_SITE_OTHER): Payer: 59 | Admitting: Family Medicine

## 2015-06-13 VITALS — BP 90/70 | HR 81 | Temp 97.7°F | Ht 67.0 in | Wt 143.0 lb

## 2015-06-13 DIAGNOSIS — J4521 Mild intermittent asthma with (acute) exacerbation: Secondary | ICD-10-CM | POA: Diagnosis not present

## 2015-06-13 DIAGNOSIS — K219 Gastro-esophageal reflux disease without esophagitis: Secondary | ICD-10-CM | POA: Diagnosis not present

## 2015-06-13 DIAGNOSIS — R7989 Other specified abnormal findings of blood chemistry: Secondary | ICD-10-CM | POA: Insufficient documentation

## 2015-06-13 DIAGNOSIS — R634 Abnormal weight loss: Secondary | ICD-10-CM | POA: Diagnosis not present

## 2015-06-13 MED ORDER — ALBUTEROL SULFATE HFA 108 (90 BASE) MCG/ACT IN AERS: 2.0000 | INHALATION_SPRAY | Freq: Four times a day (QID) | RESPIRATORY_TRACT | Status: DC | PRN

## 2015-06-13 NOTE — Progress Notes (Signed)
Subjective:    Patient ID: Alicia Wells, female    DOB: [birth year], 35 y.o.   MRN: [id number]  HPI Patient seen for the following  Weight loss. She's lost about 15 pounds in recent months. She states she initially was making effort but more recently has not been. She states she is generally only eating about one solid meal per day. She sometimes snacks in between. She has been very reluctant to be within several hours of lying down to sleep because of prior reflux symptoms. Her appetite is okay. Denies any abdominal pain. Mini recent studies including CBC, chemistries, TSH all unremarkable.  Had recent esophageal issues and probable reflux but EGD revealed no stricture and no abnormalities on biopsy. No evidence for eosinophilic esophagitis. Patient remains on low-dose omeprazole.  She's had frequent cough which is felt to be reflux related. She was followed by pulmonary. She takes as needed albuterol and requesting refill. No dyspnea at this time.  Vitamin D deficiency. 25 hydroxy vitamin D level XVI recently. She was placed on 50,000 international unit vitamin D once weekly and took for 3 months. She has not had any follow-up. Does not consume a lot of dairy  Constipation. Sometimes goes several days without bowel movement. As above, she is only eating about one regular meal per day. She has not tried any regular stool softeners or other things such as MiraLAX. She feels she is drinking enough fluids. Does not take any anticholinergic medications. Recent TSH normal  Past Medical History  Diagnosis Date  . Allergy   . Blood transfusion without reported diagnosis   . Depression   . Anxiety   . Headache(784.0)   . Asthma   . History of esophagogastroduodenoscopy (EGD)    Past Surgical History  Procedure Laterality Date  . Wisdom tooth extraction      reports that she quit smoking about 7 months ago. Her smoking use included Cigarettes. She started smoking about 13 years ago. She  has a 3.75 pack-year smoking history. She has never used smokeless tobacco. She reports that she does not drink alcohol or use illicit drugs. family history includes Asthma in her maternal grandmother and maternal uncle; Diabetes in her father. Allergies  Allergen Reactions  . Penicillins     Rash, hives        Review of Systems  Constitutional: Positive for unexpected weight change. Negative for fever, chills, appetite change and fatigue.  HENT: Negative for trouble swallowing.   Respiratory: Negative for shortness of breath.   Cardiovascular: Negative for chest pain.  Gastrointestinal: Positive for constipation. Negative for nausea, vomiting, abdominal pain and diarrhea.  Genitourinary: Negative for dysuria and hematuria.  Hematological: Negative for adenopathy.  Psychiatric/Behavioral: Negative for dysphoric mood.       Objective:   Physical Exam  Constitutional: She appears well-developed and well-nourished.  HENT:  Mouth/Throat: Oropharynx is clear and moist.  Neck: Neck supple. No thyromegaly present.  Cardiovascular: Normal rate and regular rhythm.   No murmur heard. Pulmonary/Chest: Effort normal and breath sounds normal. No respiratory distress. She has no wheezes. She has no rales.  Abdominal: Soft. Bowel sounds are normal. She exhibits no distension and no mass. There is no tenderness. There is no rebound and no guarding.  Musculoskeletal: She exhibits no edema.          Assessment & Plan:  #1 constipation. Dietary factors discussed. Recommend at least 25-30 g fiber per day. Increased consumption of fruits, vegetables, beans, whole-grain breads, high-fiber  cereal. Increased water consumption. Consider as needed MiraLAX  #2 low vitamin D. Recheck 25-hydroxy vitamin D level. Replace if indicated  #3 history of asthma-mild intermittent. Refill albuterol for as needed use  #4 history of GERD which is stable and well controlled  #5 recent modest weight loss.  Suspect related to fact she's only eating one regular meal per day. She has some fear of eating more frequently secondary to prior reflux symptoms. We discussed eating more frequent smaller meals but still avoid eating within 2 hours of bedtime.  Reassess in one month Multiple recent screening labs unremarkable with exception of low vitamin D

## 2015-06-13 NOTE — Progress Notes (Signed)
Pre visit review using our clinic review tool, if applicable. No additional management support is needed unless otherwise documented below in the visit note. 

## 2015-06-13 NOTE — Patient Instructions (Signed)
Constipation, Adult Constipation is when a person has fewer than three bowel movements a week, has difficulty having a bowel movement, or has stools that are dry, hard, or larger than normal. As people grow older, constipation is more common. A low-fiber diet, not taking in enough fluids, and taking certain medicines may make constipation worse.  CAUSES   Certain medicines, such as antidepressants, pain medicine, iron supplements, antacids, and water pills.   Certain diseases, such as diabetes, irritable bowel syndrome (IBS), thyroid disease, or depression.   Not drinking enough water.   Not eating enough fiber-rich foods.   Stress or travel.   Lack of physical activity or exercise.   Ignoring the urge to have a bowel movement.   Using laxatives too much.  SIGNS AND SYMPTOMS   Having fewer than three bowel movements a week.   Straining to have a bowel movement.   Having stools that are hard, dry, or larger than normal.   Feeling full or bloated.   Pain in the lower abdomen.   Not feeling relief after having a bowel movement.  DIAGNOSIS  Your health care provider will take a medical history and perform a physical exam. Further testing may be done for severe constipation. Some tests may include:  A barium enema X-ray to examine your rectum, colon, and, sometimes, your small intestine.   A sigmoidoscopy to examine your lower colon.   A colonoscopy to examine your entire colon. TREATMENT  Treatment will depend on the severity of your constipation and what is causing it. Some dietary treatments include drinking more fluids and eating more fiber-rich foods. Lifestyle treatments may include regular exercise. If these diet and lifestyle recommendations do not help, your health care provider may recommend taking over-the-counter laxative medicines to help you have bowel movements. Prescription medicines may be prescribed if over-the-counter medicines do not work.   HOME CARE INSTRUCTIONS   Eat foods that have a lot of fiber, such as fruits, vegetables, whole grains, and beans.  Limit foods high in fat and processed sugars, such as french fries, hamburgers, cookies, candies, and soda.   A fiber supplement may be added to your diet if you cannot get enough fiber from foods.   Drink enough fluids to keep your urine clear or pale yellow.   Exercise regularly or as directed by your health care provider.   Go to the restroom when you have the urge to go. Do not hold it.   Only take over-the-counter or prescription medicines as directed by your health care provider. Do not take other medicines for constipation without talking to your health care provider first.  [place] IF:   You have bright red blood in your stool.   Your constipation lasts for more than 4 days or gets worse.   You have abdominal or rectal pain.   You have thin, pencil-like stools.   You have unexplained weight loss. MAKE SURE YOU:   Understand these instructions.  Will watch your condition.  Will get help right away if you are not doing well or get worse.   This information is not intended to replace advice given to you by your health care provider. Make sure you discuss any questions you have with your health care provider.   Document Released: 12/06/2003 Document Revised: 03/30/2014 Document Reviewed: 12/19/2012 Elsevier Interactive Patient Education 2016 [employer].  Make sure you are getting at least 25 grams of fiber per day-raw fruits and vegetables, beans, high fiber cereals,  whole grain breads. Consider Miralax as needed if going several days without bowel movement.

## 2015-06-14 LAB — VITAMIN D 25 HYDROXY (VIT D DEFICIENCY, FRACTURES): VITD: 44.84 ng/mL (ref 30.00–100.00)

## 2015-06-25 ENCOUNTER — Encounter: Payer: Self-pay | Admitting: Gastroenterology

## 2015-06-25 ENCOUNTER — Ambulatory Visit (INDEPENDENT_AMBULATORY_CARE_PROVIDER_SITE_OTHER): Payer: 59 | Admitting: Gastroenterology

## 2015-06-25 VITALS — BP 98/62 | HR 88 | Ht 67.0 in | Wt 142.8 lb

## 2015-06-25 DIAGNOSIS — K5900 Constipation, unspecified: Secondary | ICD-10-CM

## 2015-06-25 DIAGNOSIS — K219 Gastro-esophageal reflux disease without esophagitis: Secondary | ICD-10-CM | POA: Diagnosis not present

## 2015-06-25 NOTE — Progress Notes (Signed)
HPI :  LAST VISIT: 35 y/o female seen in consultation for possible GERD and dysphagia from Dr. Carolann Littler  Patient reports she has a history of asthma as a child, and been treated for asthma recently, with concern that GERD could be contributing.  She has symptoms in her throat, symptoms of globus and dry throat. She endorses some occasional regurgitation but not routine. Otherwise she does not have much heartburn. She does endorse ongoing dysphagia. When she swallows she had dysphagia to solids to her upper chest, then passes. She reports this happens occasionally. No dysphagia to liquids. She states she has had this for the past month or so. She denies heartburn. She denies chest pains. No nausea or vomiting. No abdominal pains. She reports weight can fluctuate but no progressive loss. She has been on omeprazole 20mg  daily since September, and has increased to 40mg  BID, and then back to 20mg  BID. She thinks she may have had some benefit in her symptoms in her globus and dysphagia on this regimen. She is currently taking it once daily again.   She has had some ED visits for globus and shortness of breath. She reports the ER told her she had GERD which may be making her breathing worse. She is using albuterol and taking one puff a few times per day s which she thinks helps her symptoms. She has also been given Qvar for suspected asthma she reports earlier this week. She states he breathing has been improved on this regimen and currently denies shortness of breath or chest pains.   No FH of esophageal or gastric cancer.   SINCE LAST VISIT:  She is here for follow up. At the last visit she had globus, dysphagia, and GERD. EGD done in November and it was a normal exam.   She is here for follow up. More recently she has acid reflux bothering her. She endorses regurgitation and pyrosis. It usually occurs after she eats and when she lies down as well. She will have syptoms every other day. If she  eats spicey foods it can trigger her symptoms. If she avoids trigger foods it won't bother her as much, but she likes to eat spicey foods. She is taking omeprazole 20mg  and taking pepsid q HS. She is having symptoms every other day despite this regimen. She stopped everything for a week and symptoms got a lot worse. At higher dose omeprazole after her endoscopy, her symptoms were better controlled. She denies dysphagia at present time. No odynophagia. No nausea or vomiting. She thinks her weight is stable. Her globus has resolved.   She is otherwise having some constipation. She is having a BM once per week. She denies straining, but having hard stools. She denies taking anything for this. She denies any blood in the stools. This has been bothering her for 7 months. No FH of colon cancer. She has tried taking magnesium supplement. She has not tried miralax before.    Past Medical History  Diagnosis Date  . Allergy   . Blood transfusion without reported diagnosis   . Depression   . Anxiety   . Headache(784.0)   . Asthma   . History of esophagogastroduodenoscopy (EGD)   . GERD (gastroesophageal reflux disease)      Past Surgical History  Procedure Laterality Date  . Wisdom tooth extraction     Family History  Problem Relation Age of Onset  . Diabetes Father   . Asthma Maternal Grandmother   . Asthma Maternal  Uncle   . Pancreatic disease Neg Hx   . Colon cancer Neg Hx    Social History  Substance Use Topics  . Smoking status: Former Smoker -- 0.25 packs/day for 15 years    Types: Cigarettes    Start date: 01/12/2002    Quit date: 10/31/2014  . Smokeless tobacco: Never Used  . Alcohol Use: No   Current Outpatient Prescriptions  Medication Sig Dispense Refill  . albuterol (PROAIR HFA) 108 (90 Base) MCG/ACT inhaler Inhale 2 puffs into the lungs every 6 (six) hours as needed for wheezing or shortness of breath. 8.5 Inhaler 2  . famotidine (PEPCID AC) 10 MG chewable tablet Chew 10  mg by mouth Nightly.    [name] Kitchen omeprazole (PRILOSEC) 20 MG capsule Take 1 capsule (20 mg total) by mouth 2 (two) times daily before a meal. (Patient taking differently: Take 20 mg by mouth daily. ) 180 capsule 3   No current facility-administered medications for this visit.   Allergies  Allergen Reactions  . Penicillins     Rash, hives     Review of Systems: All systems reviewed and negative except where noted in HPI.   Lab Results  Component Value Date   WBC 11.2* 02/15/2015   HGB 12.9 02/15/2015   HCT 39.8 02/15/2015   MCV 79.4 02/15/2015   PLT 331.0 02/15/2015    Lab Results  Component Value Date   ALT 7 02/09/2015   AST 16 02/09/2015   ALKPHOS 98 02/09/2015   BILITOT 0.5 02/09/2015   Lab Results  Component Value Date   CREATININE 0.86 02/09/2015   BUN 10 02/09/2015   NA 138 02/09/2015   K 3.9 02/09/2015   CL 105 02/09/2015   CO2 26 02/09/2015     Physical Exam: BP 98/62 mmHg  Pulse 88  Ht 5\' 7"  (1.702 m)  Wt 142 lb 12.8 oz (64.774 kg)  BMI 22.36 kg/m2 Constitutional: Pleasant,well-developed, female in no acute distress. HEENT: Normocephalic and atraumatic. Conjunctivae are normal. No scleral icterus. Neck supple.  Cardiovascular: Normal rate, regular rhythm.  Pulmonary/chest: Effort normal and breath sounds normal. No wheezing, rales or rhonchi. Abdominal: Soft, nondistended, nontender. Bowel sounds active throughout. There are no masses palpable. No hepatomegaly. Extremities: no edema Lymphadenopathy: No cervical adenopathy noted. Neurological: Alert and oriented to person place and time. Skin: Skin is warm and dry. No rashes noted. Psychiatric: Normal mood and affect. Behavior is normal.   ASSESSMENT AND PLAN: 35 y/o female who previously presented with globus, dysphagia, and reflux, all improved on higher dose PPI. Her EGD was normal. Now she has backed off PPI to omeprazole 20mg  daily and using pepsid daily. She has clear trigger foods which  precipitate symptoms through her medications. I discussed options for therapy for her. Recommend she avoid trigger foods if possible, avoid eating 3 hrs prior to bed, and sleep with head of bed elevated if needed. I discussed medical options for her reflux and discussed risks / benefits of PPIs. The risks of long term PPIs with current data include increased risk for chronic kidney disease, increased risk of fracture, increased risk of C diff, increased risk of pneumonia (short term usage), potentially increased risk of B12 / calcium deficiency, and rare risk of hypomagnesemia. Recent studies have also shown an association with increased risk of dementia and cardiovascular outcomes including stroke. These studies have showed an association between PPIs and several of these outcomes but no evidence of causality. The patient was counseled to use the lowest  daily use of PPI needed to control symptoms. At this time if her symptoms bother her significantly she can increase omeprazole to 20mg  BID and up to max dose of 40mg  BID, and use pepsid PRN. Moving forward if she is able to eliminate trigger foods this may be able to be managed with pepcid monotherapy. She can follow up as needed for symptoms. We discussed surgical options for reflux if she wished to avoid taking medications for it, and she declined this evaluation for now. Otherwise offered her a trial of miralax for constipation. She can call if no improvement on this regimen.   Sequoyah Cellar, MD [facility] Pager [phone]

## 2015-06-25 NOTE — Patient Instructions (Signed)
Please increase your omeprazole back to 20 mg twice daily dosing.  Please take your Miralax 17 grams (1 capful) twice daily.  Follow up with Dr Havery Moros as needed.  If you are age 37 or older, your body mass index should be between 23-30. Your Body mass index is 22.36 kg/(m^2). If this is out of the aforementioned range listed, please consider follow up with your Primary Care Provider.  If you are age 36 or younger, your body mass index should be between 19-25. Your Body mass index is 22.36 kg/(m^2). If this is out of the aformentioned range listed, please consider follow up with your Primary Care Provider.

## 2015-06-30 ENCOUNTER — Ambulatory Visit (INDEPENDENT_AMBULATORY_CARE_PROVIDER_SITE_OTHER): Payer: 59

## 2015-06-30 ENCOUNTER — Ambulatory Visit (INDEPENDENT_AMBULATORY_CARE_PROVIDER_SITE_OTHER): Payer: 59 | Admitting: Physician Assistant

## 2015-06-30 VITALS — BP 98/60 | HR 88 | Temp 97.9°F | Resp 16 | Ht 66.0 in | Wt 139.6 lb

## 2015-06-30 DIAGNOSIS — G44209 Tension-type headache, unspecified, not intractable: Secondary | ICD-10-CM | POA: Diagnosis not present

## 2015-06-30 DIAGNOSIS — R718 Other abnormality of red blood cells: Secondary | ICD-10-CM

## 2015-06-30 DIAGNOSIS — R634 Abnormal weight loss: Secondary | ICD-10-CM

## 2015-06-30 LAB — POCT CBC
Granulocyte percent: 51.2 %G (ref 37–80)
HCT, POC: 32.4 % — AB (ref 37.7–47.9)
Hemoglobin: 11 g/dL — AB (ref 12.2–16.2)
LYMPH, POC: 2 (ref 0.6–3.4)
MCH, POC: 26 pg — AB (ref 27–31.2)
MCHC: 34.1 g/dL (ref 31.8–35.4)
MCV: 76.1 fL — AB (ref 80–97)
MID (CBC): 0.3 (ref 0–0.9)
MPV: 7.5 fL (ref 0–99.8)
POC Granulocyte: 2.5 (ref 2–6.9)
POC LYMPH %: 41.9 % (ref 10–50)
POC MID %: 6.9 % (ref 0–12)
Platelet Count, POC: 264 10*3/uL (ref 142–424)
RBC: 4.25 M/uL (ref 4.04–5.48)
RDW, POC: 18.5 %
WBC: 4.8 10*3/uL (ref 4.6–10.2)

## 2015-06-30 LAB — POCT URINALYSIS DIP (MANUAL ENTRY)
Bilirubin, UA: NEGATIVE
GLUCOSE UA: NEGATIVE
Ketones, POC UA: NEGATIVE
Leukocytes, UA: NEGATIVE
NITRITE UA: NEGATIVE
PROTEIN UA: NEGATIVE
Spec Grav, UA: 1.01
UROBILINOGEN UA: 0.2
pH, UA: 7.5

## 2015-06-30 LAB — GLUCOSE, POCT (MANUAL RESULT ENTRY): POC Glucose: 95 mg/dl (ref 70–99)

## 2015-06-30 MED ORDER — DOXYCYCLINE HYCLATE 100 MG PO CAPS: 100.0000 mg | ORAL_CAPSULE | Freq: Two times a day (BID) | ORAL | Status: AC

## 2015-06-30 MED ORDER — IBUPROFEN 800 MG PO TABS: 800.0000 mg | ORAL_TABLET | Freq: Three times a day (TID) | ORAL | Status: DC | PRN

## 2015-06-30 MED ORDER — RANITIDINE HCL 150 MG PO TABS: 150.0000 mg | ORAL_TABLET | Freq: Two times a day (BID) | ORAL | Status: DC

## 2015-06-30 NOTE — Progress Notes (Signed)
06/30/2015 4:28 PM   DOB: 1980-05-05 / MRN: [id number]  SUBJECTIVE:  Alicia Wells is a 35 y.o. female presenting for sinus HA that has been present for four days.  States she is just over the flu and did not take tamiflu.  She has tried 800 mg Ibuprofen without relief.  Denies sore throat, cough, fever, chills.    She has a history of gerd and has a difficult time controlling this.  She has been on PPI therapy for about 6 months and reports this works well for her, but when she tries to stop the medication her symptoms return.  She has a particularly difficult time with saucy rich foods and lying flat on her back. She had a scope in November and was told this was normal.   She has a history of depression and is currently off of her Prozac. Complains of weight loss and attributes this to GERD.  Sleeps well and denies anhedonia, psychomotor slowing and SI/HI.      She is allergic to penicillins.   She  has a past medical history of Allergy; Blood transfusion without reported diagnosis; Depression; Anxiety; Headache(784.0); Asthma; History of esophagogastroduodenoscopy (EGD); and GERD (gastroesophageal reflux disease).    She  reports that she quit smoking about 7 months ago. Her smoking use included Cigarettes. She started smoking about 13 years ago. She has a 3.75 pack-year smoking history. She has never used smokeless tobacco. She reports that she does not drink alcohol or use illicit drugs. She  has no sexual activity history on file. The patient  has past surgical history that includes Wisdom tooth extraction.  Her family history includes Asthma in her maternal grandmother and maternal uncle; Diabetes in her father. There is no history of Pancreatic disease or Colon cancer.  Review of Systems  Constitutional: Negative for fever and chills.  Respiratory: Negative for cough.   Cardiovascular: Negative for chest pain.  Gastrointestinal: Positive for heartburn. Negative for vomiting,  abdominal pain, blood in stool and melena.  Skin: Negative for rash.  Neurological: Negative for weakness.  Psychiatric/Behavioral: Negative for depression, suicidal ideas and memory loss. The patient is not nervous/anxious and does not have insomnia.     Problem list and medications reviewed and updated by myself where necessary, and exist elsewhere in the encounter.   OBJECTIVE:  BP 98/60 mmHg  Pulse 88  Temp(Src) 97.9 F (36.6 C) (Oral)  Resp 16  Ht 5\' 6"  (1.676 m)  Wt 139 lb 9.6 oz (63.322 kg)  BMI 22.54 kg/m2  SpO2 98%  LMP 06/27/2015  Physical Exam  Constitutional: She is oriented to person, place, and time. She appears well-nourished. No distress.  Eyes: EOM are normal. Pupils are equal, round, and reactive to light.  Cardiovascular: Normal rate and regular rhythm.   Pulmonary/Chest: Effort normal.  Abdominal: Bowel sounds are normal. She exhibits no distension and no mass. There is no tenderness. There is no rebound and no guarding.  Musculoskeletal: Normal range of motion.  Neurological: She is alert and oriented to person, place, and time. No cranial nerve deficit. Gait normal.  Skin: Skin is warm and dry. She is not diaphoretic.  Psychiatric: She has a normal mood and affect.  Vitals reviewed.   Results for orders placed or performed in visit on 06/30/15 (from the past 72 hour(s))  POCT CBC     Status: Abnormal   Collection Time: 06/30/15  3:42 PM  Result Value Ref Range   WBC 4.8 4.6 -  10.2 K/uL   Lymph, poc 2.0 0.6 - 3.4   POC LYMPH PERCENT 41.9 10 - 50 %L   MID (cbc) 0.3 0 - 0.9   POC MID % 6.9 0 - 12 %M   POC Granulocyte 2.5 2 - 6.9   Granulocyte percent 51.2 37 - 80 %G   RBC 4.25 4.04 - 5.48 M/uL   Hemoglobin 11.0 (A) 12.2 - 16.2 g/dL   HCT, POC 32.4 (A) 37.7 - 47.9 %   MCV 76.1 (A) 80 - 97 fL   MCH, POC 26.0 (A) 27 - 31.2 pg   MCHC 34.1 31.8 - 35.4 g/dL   RDW, POC 18.5 %   Platelet Count, POC 264 142 - 424 K/uL   MPV 7.5 0 - 99.8 fL  POCT glucose  (manual entry)     Status: Normal   Collection Time: 06/30/15  3:42 PM  Result Value Ref Range   POC Glucose 95 70 - 99 mg/dl  POCT urinalysis dipstick     Status: Abnormal   Collection Time: 06/30/15  3:45 PM  Result Value Ref Range   Color, UA yellow yellow   Clarity, UA clear clear   Glucose, UA negative negative   Bilirubin, UA negative negative   Ketones, POC UA negative negative   Spec Grav, UA 1.010    Blood, UA trace-lysed (A) negative   pH, UA 7.5    Protein Ur, POC negative negative   Urobilinogen, UA 0.2    Nitrite, UA Negative Negative   Leukocytes, UA Negative Negative    Dg Sinus 1-2 Views  06/30/2015  CLINICAL DATA:  Headaches EXAM: PARANASAL SINUSES - 1 VIEW COMPARISON:  None FINDINGS: The frontal, ethmoid and maxillary sinuses are well aerated. Mucosal thickening is noted within the maxillary antra bilaterally. No definitive air-fluid level is seen. No acute bony abnormality is noted. IMPRESSION: Mucosal thickening within the maxillary antra bilaterally. Electronically Signed   By: Inez Catalina M.D.   On: 06/30/2015 16:20    ASSESSMENT AND PLAN  Alicia Wells was seen today for headache and gastroesophageal reflux.  Diagnoses and all orders for this visit:  Tension-type headache, not intractable, unspecified chronicity pattern: Will treat for ABRS given the lack of other symptoms and mucosal thickening on rads. Will revisit this in 2 weeks at recheck.   -     POCT CBC -     Cancel: POCT Microscopic Urinalysis ([facility]) -     POCT urinalysis dipstick -     DG SinUS 1-2 Views; Future  Unintentional weight loss: She has a history of depression and has a flat affect.  Advised that she restart her prozac.   -     POCT glucose (manual entry)  RBC microcytosis: She has a history of GERD.  Will stop her PPI and will run a breath test in two weeks at recheck.  She is to take Ranitidine BID before meals until that time. This may be reflex esophagitis from PPI cessation and the  anemia is secondary to menstruation.    -     Iron    The patient was advised to call or return to clinic if she does not see an improvement in symptoms or to seek the care of the closest emergency department if she worsens with the above plan.   Philis Fendt, MHS, PA-C [facility] 06/30/2015 4:28 PM

## 2015-06-30 NOTE — Patient Instructions (Signed)
     IF you received an x-ray today, you will receive an invoice from [facility]. Please contact [facility] at [phone] with questions or concerns regarding your invoice.   IF you received labwork today, you will receive an invoice from [facility]. Please contact [facility] at [phone] with questions or concerns regarding your invoice.   Our billing staff will not be able to assist you with questions regarding bills from these companies.  You will be contacted with the lab results as soon as they are available. The fastest way to get your results is to activate your My Chart account. Instructions are located on the last page of this paperwork. If you have not heard from us regarding the results in 2 weeks, please contact this office.         IF you received an x-ray today, you will receive an invoice from [facility]. Please contact  Radiology at [phone] with questions or concerns regarding your invoice.   IF you received labwork today, you will receive an invoice from [facility]. Please contact [facility] at [phone] with questions or concerns regarding your invoice.   Our billing staff will not be able to assist you with questions regarding bills from these companies.  You will be contacted with the lab results as soon as they are available. The fastest way to get your results is to activate your My Chart account. Instructions are located on the last page of this paperwork. If you have not heard from us regarding the results in 2 weeks, please contact this office.     

## 2015-07-01 LAB — IRON: Iron: 18 ug/dL — ABNORMAL LOW (ref 40–190)

## 2015-07-05 ENCOUNTER — Telehealth: Payer: Self-pay

## 2015-07-05 NOTE — Telephone Encounter (Signed)
Alicia Wells   Patient wants to know the strength of the IRON to take.  Is there one brand better than the others?    [phone]

## 2015-07-09 NOTE — Telephone Encounter (Signed)
OTC iron 325 tid for 2 months.  Please advise of constipation and some OTC strategies. Philis Fendt, MS, PA-C 7:03 PM, 07/09/2015

## 2015-07-10 NOTE — Telephone Encounter (Signed)
Called and discussed OTC iron and how to help prevent constipation while taking it.

## 2015-07-15 ENCOUNTER — Ambulatory Visit: Payer: 59 | Admitting: Family Medicine

## 2016-02-15 ENCOUNTER — Telehealth: Payer: Self-pay | Admitting: Family Medicine

## 2016-02-15 DIAGNOSIS — J4521 Mild intermittent asthma with (acute) exacerbation: Secondary | ICD-10-CM

## 2016-02-17 NOTE — Telephone Encounter (Signed)
Medications called in for patient.

## 2016-02-17 NOTE — Telephone Encounter (Signed)
Pt following up on request refill  omeprazole (PRILOSEC) 20 MG capsule  3 mo supply  albuterol (PROAIR HFA) 108 (90 Base) MCG/ACT inhaler Pt request 5 refills  [facility] [address]

## 2016-05-09 ENCOUNTER — Ambulatory Visit (HOSPITAL_COMMUNITY)
Admission: EM | Admit: 2016-05-09 | Discharge: 2016-05-09 | Disposition: A | Discharge status: Home / Self Care | Payer: 59 | Attending: Family Medicine | Admitting: Family Medicine

## 2016-05-09 ENCOUNTER — Encounter (HOSPITAL_COMMUNITY): Payer: Self-pay | Admitting: Emergency Medicine

## 2016-05-09 DIAGNOSIS — M542 Cervicalgia: Secondary | ICD-10-CM | POA: Diagnosis not present

## 2016-05-09 DIAGNOSIS — M25512 Pain in left shoulder: Secondary | ICD-10-CM

## 2016-05-09 DIAGNOSIS — M25511 Pain in right shoulder: Secondary | ICD-10-CM | POA: Diagnosis not present

## 2016-05-09 MED ORDER — IBUPROFEN 800 MG PO TABS: 800.0000 mg | ORAL_TABLET | Freq: Three times a day (TID) | ORAL | 0 refills | Status: AC

## 2016-05-09 MED ORDER — CYCLOBENZAPRINE HCL 10 MG PO TABS: 10.0000 mg | ORAL_TABLET | Freq: Three times a day (TID) | ORAL | 0 refills | Status: AC | PRN
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
## 2016-10-02 ENCOUNTER — Encounter: Payer: Self-pay | Admitting: Family Medicine

## 2016-10-02 ENCOUNTER — Ambulatory Visit (INDEPENDENT_AMBULATORY_CARE_PROVIDER_SITE_OTHER): Payer: 59 | Admitting: Family Medicine

## 2016-10-02 VITALS — BP 112/72 | HR 78 | Temp 98.0°F | Resp 18 | Ht 67.32 in | Wt 140.0 lb

## 2016-10-02 DIAGNOSIS — G43709 Chronic migraine without aura, not intractable, without status migrainosus: Secondary | ICD-10-CM | POA: Diagnosis not present

## 2016-10-02 DIAGNOSIS — K219 Gastro-esophageal reflux disease without esophagitis: Secondary | ICD-10-CM | POA: Diagnosis not present

## 2016-10-02 DIAGNOSIS — F419 Anxiety disorder, unspecified: Secondary | ICD-10-CM | POA: Diagnosis not present

## 2016-10-02 DIAGNOSIS — F99 Mental disorder, not otherwise specified: Secondary | ICD-10-CM

## 2016-10-02 DIAGNOSIS — F5105 Insomnia due to other mental disorder: Secondary | ICD-10-CM

## 2016-10-02 MED ORDER — ESCITALOPRAM OXALATE 5 MG PO TABS: 5.0000 mg | ORAL_TABLET | Freq: Every day | ORAL | 3 refills | Status: DC

## 2016-10-02 NOTE — Patient Instructions (Addendum)
Stop by therapist office on [address] in the upcoming week.  Start exercising at least 3 days per week.     Generalized Anxiety Disorder, Adult Generalized anxiety disorder (GAD) is a mental health disorder. People with this condition constantly worry about everyday events. Unlike normal anxiety, worry related to GAD is not triggered by a specific event. These worries also do not fade or get better with time. GAD interferes with life functions, including relationships, work, and school. GAD can vary from mild to severe. People with severe GAD can have intense waves of anxiety with physical symptoms (panic attacks). What are the causes? The exact cause of GAD is not known. What increases the risk? This condition is more likely to develop in:  Women.  People who have a family history of anxiety disorders.  People who are very shy.  People who experience very stressful life events, such as the death of a loved one.  People who have a very stressful family environment.  What are the signs or symptoms? People with GAD often worry excessively about many things in their lives, such as their health and family. They may also be overly concerned about:  Doing well at work.  Being on time.  Natural disasters.  Friendships.  Physical symptoms of GAD include:  Fatigue.  Muscle tension or having muscle twitches.  Trembling or feeling shaky.  Being easily startled.  Feeling like your heart is pounding or racing.  Feeling out of breath or like you cannot take a deep breath.  Having trouble falling asleep or staying asleep.  Sweating.  Nausea, diarrhea, or irritable bowel syndrome (IBS).  Headaches.  Trouble concentrating or remembering facts.  Restlessness.  Irritability.  How is this diagnosed? Your health care provider can diagnose GAD based on your symptoms and medical history. You will also have a physical exam. The health care provider will ask specific questions  about your symptoms, including how severe they are, when they started, and if they come and go. Your health care provider may ask you about your use of alcohol or drugs, including prescription medicines. Your health care provider may refer you to a mental health specialist for further evaluation. Your health care provider will do a thorough examination and may perform additional tests to rule out other possible causes of your symptoms. To be diagnosed with GAD, a person must have anxiety that:  Is out of his or her control.  Affects several different aspects of his or her life, such as work and relationships.  Causes distress that makes him or her unable to take part in normal activities.  Includes at least three physical symptoms of GAD, such as restlessness, fatigue, trouble concentrating, irritability, muscle tension, or sleep problems.  Before your health care provider can confirm a diagnosis of GAD, these symptoms must be present more days than they are not, and they must last for six months or longer. How is this treated? The following therapies are usually used to treat GAD:  Medicine. Antidepressant medicine is usually prescribed for long-term daily control. Antianxiety medicines may be added in severe cases, especially when panic attacks occur.  Talk therapy (psychotherapy). Certain types of talk therapy can be helpful in treating GAD by providing support, education, and guidance. Options include: ? Cognitive behavioral therapy (CBT). People learn coping skills and techniques to ease their anxiety. They learn to identify unrealistic or negative thoughts and behaviors and to replace them with positive ones. ? Acceptance and commitment therapy (ACT). This  treatment teaches people how to be mindful as a way to cope with unwanted thoughts and feelings. ? Biofeedback. This process trains you to manage your body's response (physiological response) through breathing techniques and relaxation  methods. You will work with a therapist while machines are used to monitor your physical symptoms.  Stress management techniques. These include yoga, meditation, and exercise.  A mental health specialist can help determine which treatment is best for you. Some people see improvement with one type of therapy. However, other people require a combination of therapies. Follow these instructions at home:  Take over-the-counter and prescription medicines only as told by your health care provider.  Try to maintain a normal routine.  Try to anticipate stressful situations and allow extra time to manage them.  Practice any stress management or self-calming techniques as taught by your health care provider.  Do not punish yourself for setbacks or for not making progress.  Try to recognize your accomplishments, even if they are small.  Keep all follow-up visits as told by your health care provider. This is important. Contact a health care provider if:  Your symptoms do not get better.  Your symptoms get worse.  You have signs of depression, such as: ? A persistently sad, cranky, or irritable mood. ? Loss of enjoyment in activities that used to bring you joy. ? Change in weight or eating. ? Changes in sleeping habits. ? Avoiding friends or family members. ? Loss of energy for normal tasks. ? Feelings of guilt or worthlessness. Get help right away if:  You have serious thoughts about hurting yourself or others. If you ever feel like you may hurt yourself or others, or have thoughts about taking your own life, get help right away. You can go to your nearest emergency department or call:  Your local emergency services (911 in the [place]  A suicide crisis helpline, such as the [facility] at 564-739-1546. This is open 24 hours a day.  Summary  Generalized anxiety disorder (GAD) is a mental health disorder that involves worry that is not triggered by a specific  event.  People with GAD often worry excessively about many things in their lives, such as their health and family.  GAD may cause physical symptoms such as restlessness, trouble concentrating, sleep problems, frequent sweating, nausea, diarrhea, headaches, and trembling or muscle twitching.  A mental health specialist can help determine which treatment is best for you. Some people see improvement with one type of therapy. However, other people require a combination of therapies. This information is not intended to replace advice given to you by your health care provider. Make sure you discuss any questions you have with your health care provider. Document Released: 07/04/2012 Document Revised: 01/28/2016 Document Reviewed: 01/28/2016 Elsevier Interactive Patient Education  2018 [employer].   IF you received an x-ray today, you will receive an invoice from [facility]. Please contact [facility] at [phone] with questions or concerns regarding your invoice.   IF you received labwork today, you will receive an invoice from [facility]. Please contact [facility] at [phone] with questions or concerns regarding your invoice.   Our billing staff will not be able to assist you with questions regarding bills from these companies.  You will be contacted with the lab results as soon as they are available. The fastest way to get your results is to activate your My Chart account. Instructions are located on the last page of this paperwork. If you have not heard  from [place] regarding the results in 2 weeks, please contact this office.

## 2016-10-02 NOTE — Progress Notes (Signed)
Subjective:    Patient ID: Alicia Wells, female    DOB: [birth year], 36 y.o.   MRN: [id number]  10/02/2016  Anxiety ( pt states her anxiety is becoming unbareble for the last few months )   HPI This 36 y.o. female presents for evaluation of worsening anxiety.   Pulling hair out; chronic issue. Works at post [employer].  Afriad to live alone; stays with sister.  Works third shift.  Nephew lives with patient and mother.   Stress at work with coworkers.   Goes to bed 5:00am; wakes up at 8:00am and falls back asleep 9:00am; wakes up at 11:00am; falls back asleep but must get up at 2:30pm.  Goes in two hours before shift for overtime Tuesday through Fridays. Has Melatonin but afraid to take a whole tablet; cut Melatonin in half.    Has taken medication for anxiety in the past; has taken Prozac (felt different at work; bad taste in mouth, nausea), took Clonazepam but did not like how it made her feel.  Did not feel right with Sertraline.   Does not take much medication.  Scared to take other pills.    Did undergo counseling in 2010 that was beneficial.  Off of [place].   Not exercising right now.   No gym membership. Has coworkers that go to gym after work at [employer].   Some caffeine intake; started drinking Red Bull.     Calling out of work sometimes lately due to anxiety; started calling out last month; there are times that does not want to go to work.  Employed for seventeen years; full time for three years.  In past month, has called out seven days.   Has been so stressed.    BP Readings from Last 3 Encounters:  10/02/16 112/72  05/09/16 102/66  06/30/15 98/60   Wt Readings from Last 3 Encounters:  10/02/16 140 lb (63.5 kg)  05/09/16 140 lb (63.5 kg)  06/30/15 139 lb 9.6 oz (63.3 kg)   Immunization History  Administered Date(s) Administered  . Tdap 03/31/2010    Review of Systems  Constitutional: Negative for chills, diaphoresis, fatigue and fever.  Eyes: Negative for  visual disturbance.  Respiratory: Negative for cough and shortness of breath.   Cardiovascular: Negative for chest pain, palpitations and leg swelling.  Gastrointestinal: Negative for abdominal pain, constipation, diarrhea, nausea and vomiting.  Endocrine: Negative for cold intolerance, heat intolerance, polydipsia, polyphagia and polyuria.  Neurological: Negative for dizziness, tremors, seizures, syncope, facial asymmetry, speech difficulty, weakness, light-headedness, numbness and headaches.  Psychiatric/Behavioral: Positive for sleep disturbance. Negative for dysphoric mood and self-injury. The patient is nervous/anxious.     Past Medical History:  Diagnosis Date  . Allergy   . Anxiety   . Asthma   . Blood transfusion without reported diagnosis   . Depression   . GERD (gastroesophageal reflux disease)   . Headache(784.0)   . History of esophagogastroduodenoscopy (EGD)    Past Surgical History:  Procedure Laterality Date  . WISDOM TOOTH EXTRACTION     Allergies  Allergen Reactions  . Penicillins     Rash, hives    Social History   Social History  . Marital status: Single    Spouse name: N/A  . Number of children: N/A  . Years of education: N/A   Occupational History  . employed     post office clerk   Social History Main Topics  . Smoking status: Former Smoker    Packs/day: 0.25  Years: 15.00    Types: Cigarettes    Start date: 01/12/2002    Quit date: 10/31/2014  . Smokeless tobacco: Never Used  . Alcohol use No  . Drug use: No  . Sexual activity: Not on file   Other Topics Concern  . Not on file   Social History Narrative   Marital status: single     Children: none      Lives: with mom      Employment:  [place] [employer], prison and probation services x 12 years      Tobacco: 1 ppd since 2002      Alcohol: none      Drugs: none      Exercise:  none   Family History  Problem Relation Age of Onset  . Diabetes Father   . Asthma Maternal Grandmother   . Asthma Maternal  Uncle   . Pancreatic disease Neg Hx   . Colon cancer Neg Hx        Objective:    BP 112/72   Pulse 78   Temp 98 F (36.7 C) (Oral)   Resp 18   Ht 5' 7.32" (1.71 m)   Wt 140 lb (63.5 kg)   LMP 09/09/2016 (Approximate)   SpO2 99%   BMI 21.72 kg/m  Physical Exam  Constitutional: She is oriented to person, place, and time. She appears well-developed and well-nourished. No distress.  HENT:  Head: Normocephalic and atraumatic.  Right Ear: External ear normal.  Left Ear: External ear normal.  Nose: Nose normal.  Mouth/Throat: Oropharynx is clear and moist.  Eyes: Pupils are equal, round, and reactive to light. Conjunctivae and EOM are normal.  Neck: Normal range of motion. Neck supple. Carotid bruit is not present. No thyromegaly present.  Cardiovascular: Normal rate, regular rhythm, normal heart sounds and intact distal pulses.  Exam reveals no gallop and no friction rub.   No murmur heard. Pulmonary/Chest: Effort normal and breath sounds normal. She has no wheezes. She has no rales.  Abdominal: Soft. Bowel sounds are normal. She exhibits no distension and no mass. There is no tenderness. There is no rebound and no guarding.  Lymphadenopathy:    She has no cervical adenopathy.  Neurological: She is alert and oriented to person, place, and time. No cranial nerve deficit. She exhibits normal muscle tone. Coordination normal.  Skin: Skin is warm and dry. No rash noted. She is not diaphoretic. No erythema. No pallor.  Psychiatric: She has a normal mood and affect. Her behavior is normal. Judgment and thought content normal.   Depression screen [facility] 2/9 10/02/2016 06/30/2015 02/09/2015 11/26/2014  Decreased Interest 0 0 0 0  Down, Depressed, Hopeless 0 0 0 0  PHQ - 2 Score 0 0 0 0        Assessment & Plan:   1. 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 Gastroesophageal reflux disease without esophagitis   4. Chronic migraine without aura without status migrainosus,  not intractable    -recurrent anxiety moderate to severe and has missed significant amount of work in the past month. -rx for Lexapro 5mg  qhs provided. -highly recommend daily exercise for 30 minutes minimum.  Caffeine avoidance also recommended. -highly encourage pt not to miss work due to anxiety.   -recent worsening of GERD symptoms as well; continue current PPI and H2 blocker; dietary modification advised.  -highly recommend psychotherapy and patient agreeable and plans to contact therapist today.   No orders of the  defined types were placed in this encounter.  Meds ordered this encounter  Medications  . escitalopram (LEXAPRO) 5 MG tablet    Sig: Take 1 tablet (5 mg total) by mouth at bedtime.    Dispense:  30 tablet    Refill:  3    Return in about 4 weeks (around 10/30/2016) for recheck anxiety.   Kristi Elayne Guerin, M.D. Primary Care at [facility] [address] [place], [place]  [address] [phone] phone [phone] fax

## 2016-10-05 ENCOUNTER — Ambulatory Visit: Payer: 59 | Admitting: Family Medicine

## 2016-10-19 IMAGING — CR DG CHEST 2V
2 series · 2 of 2 positions shown · non-contrast
Comparison: 05/11/2007

CLINICAL DATA: Shortness of breath.  Chest pressure.  Allergies.

EXAM:
CHEST  2 VIEW

[chest pa]
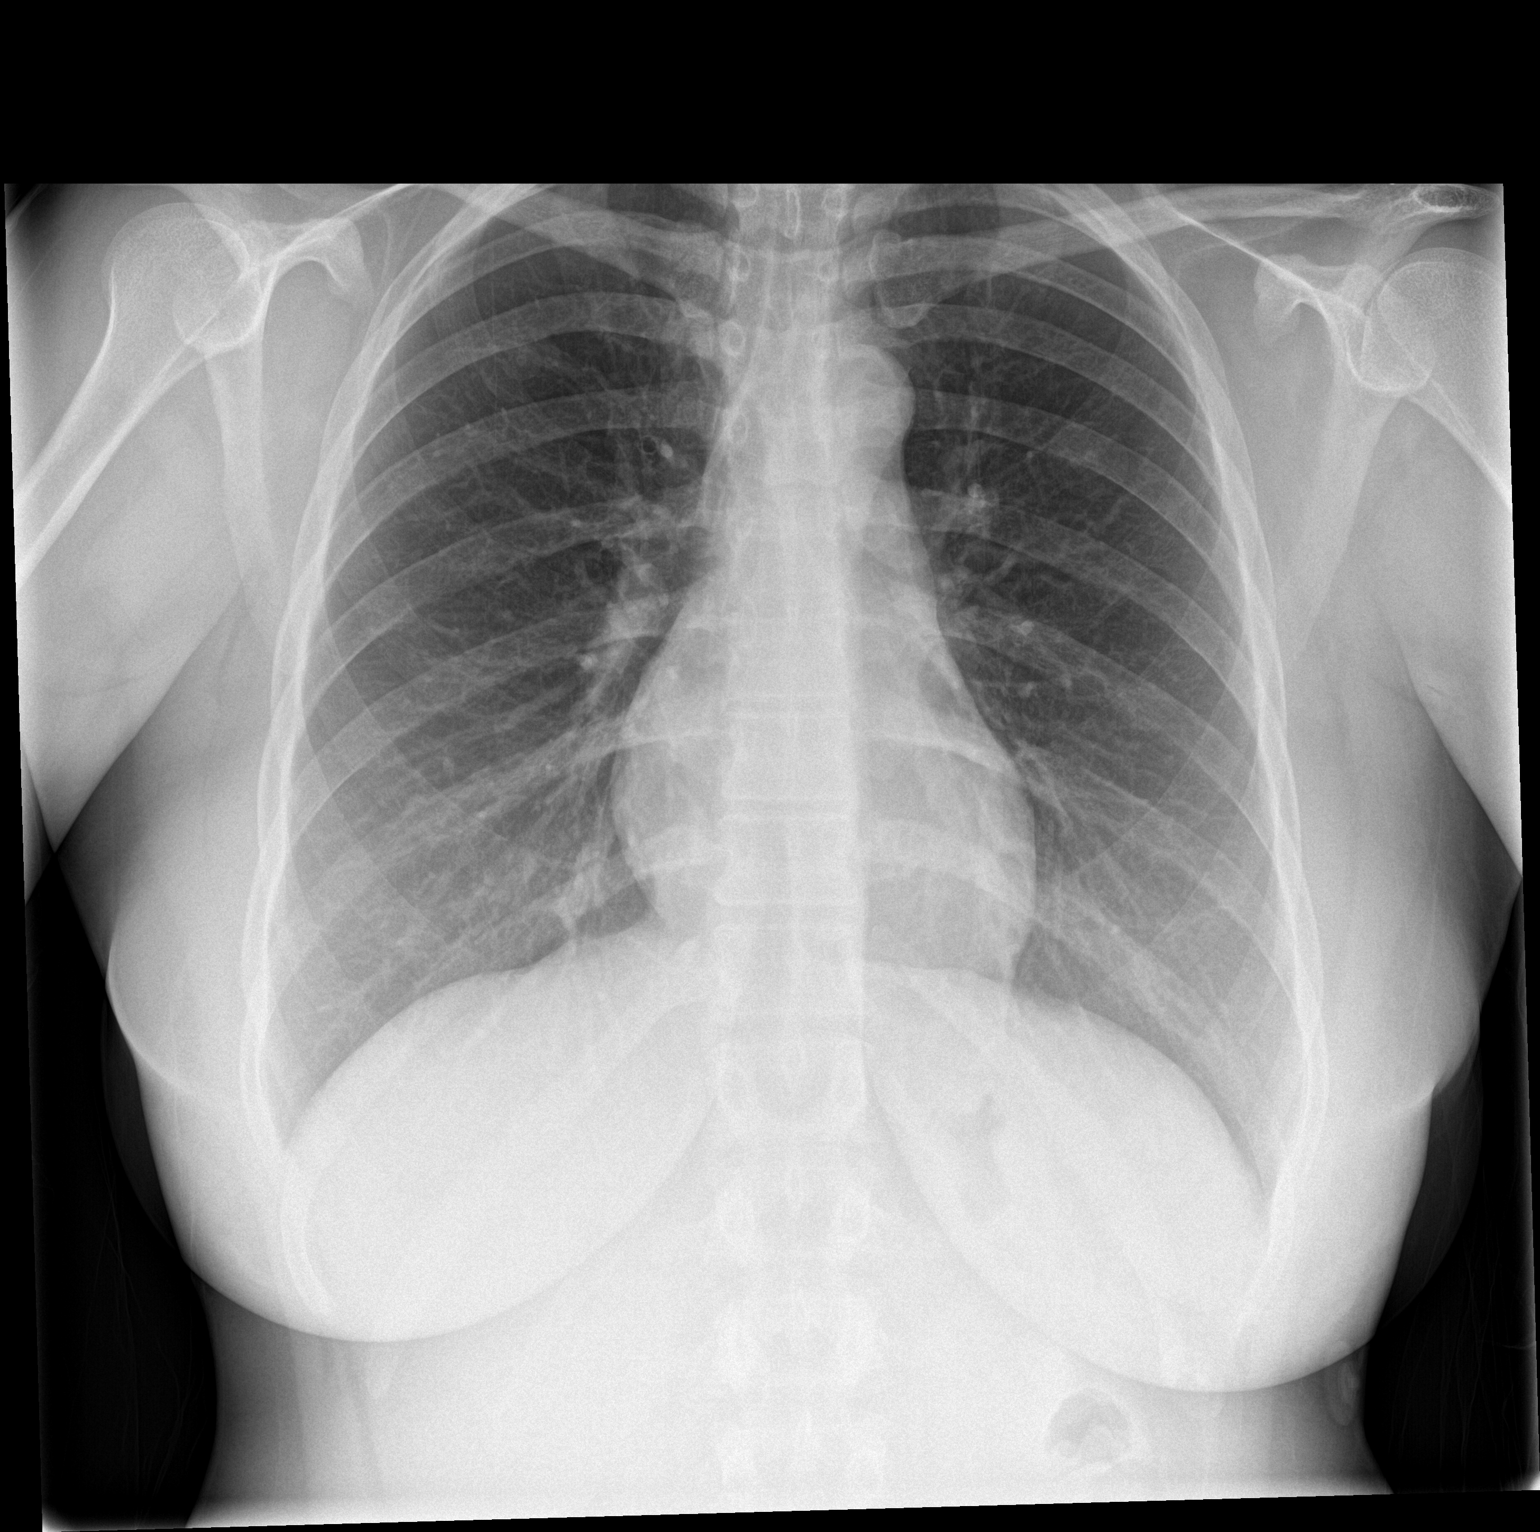

[chest lat]
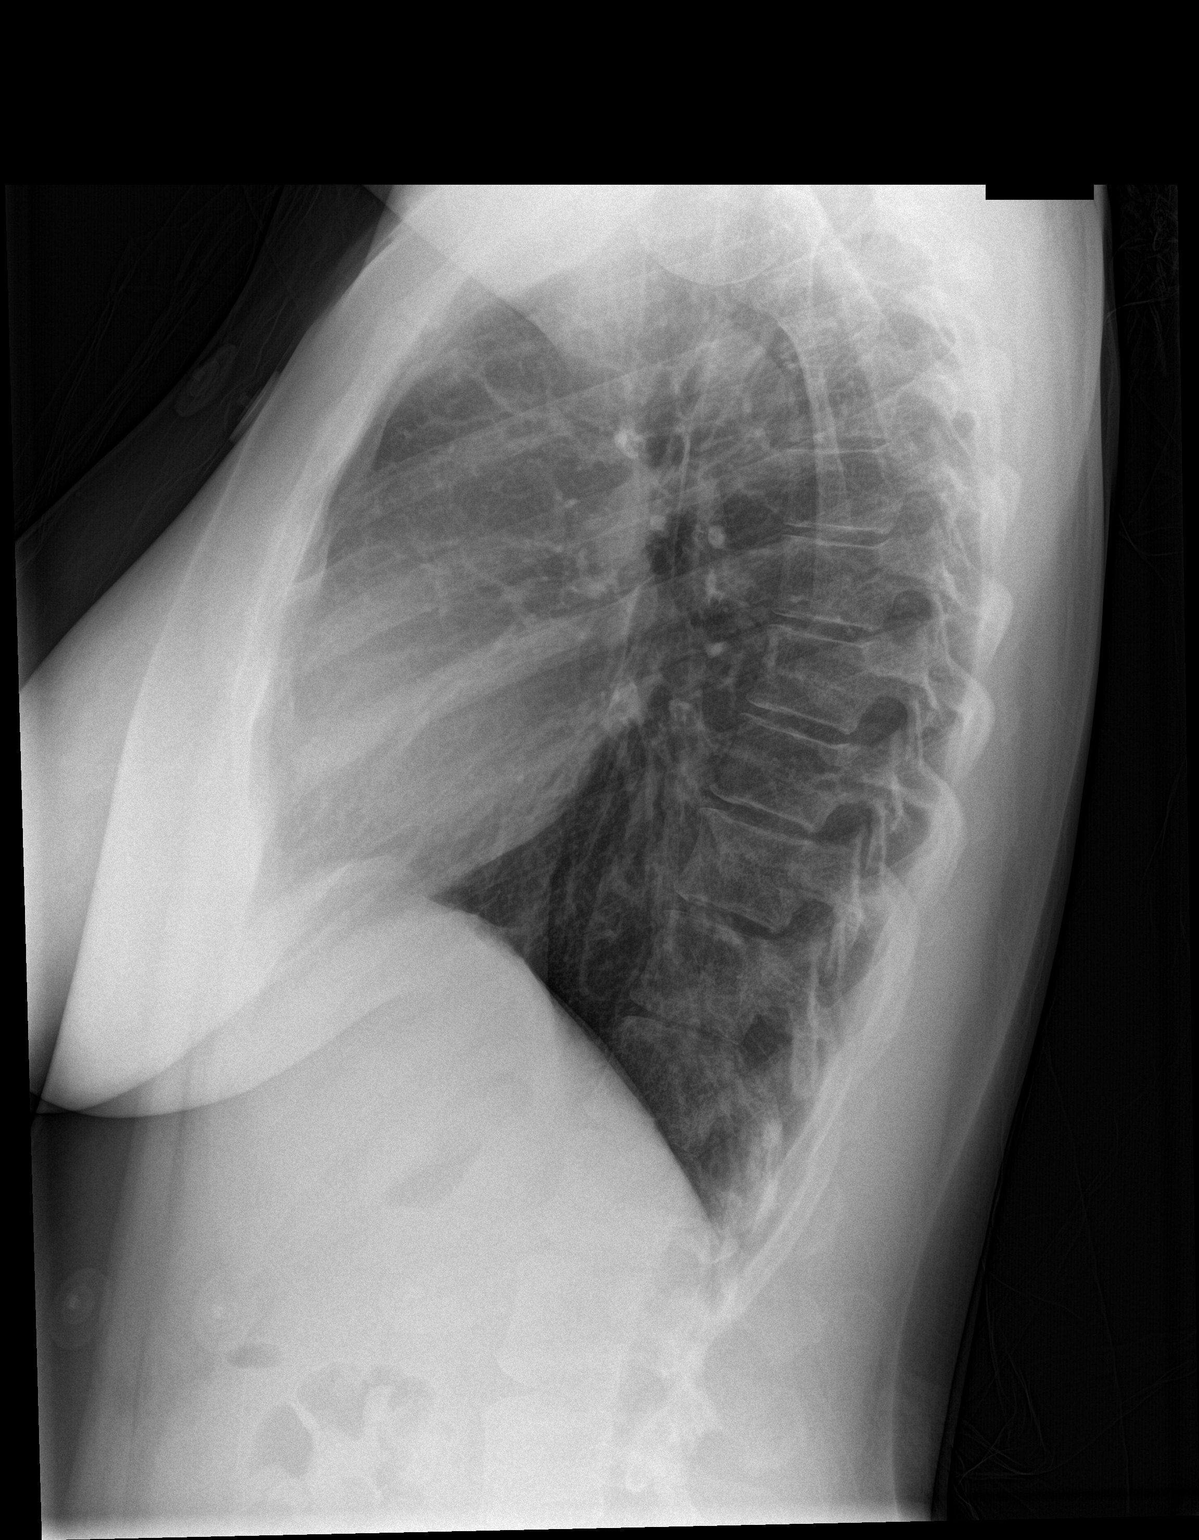

[2 of 2 positions shown; findings below may reference images not displayed]

FINDINGS: The heart size and mediastinal contours are within normal limits.
Both lungs are clear. The visualized skeletal structures are
unremarkable.
IMPRESSION: No active cardiopulmonary disease.

## 2016-10-26 ENCOUNTER — Telehealth: Payer: Self-pay | Admitting: Family Medicine

## 2016-10-26 NOTE — Telephone Encounter (Signed)
Patient states she gave her FMLA forms to Dr Tamala Julian during her visit on 10/02/16. She is now calling because they were suppose to be turned in last week and her employer has not gotten them. I called patient and let her know that they have not come across my desk at all, she did not fill out the [facility] Form so there was no paper trail for these forms.  Dr Tamala Julian do you know where these forms are? Medical records have you seen anything with this patients name on it?

## 2016-10-28 ENCOUNTER — Encounter (HOSPITAL_COMMUNITY): Payer: Self-pay | Admitting: Emergency Medicine

## 2016-10-28 ENCOUNTER — Emergency Department (HOSPITAL_COMMUNITY)
Admission: EM | Admit: 2016-10-28 | Discharge: 2016-10-29 | Disposition: A | Discharge status: Home / Self Care | Attending: Emergency Medicine | Admitting: Emergency Medicine

## 2016-10-28 ENCOUNTER — Emergency Department (HOSPITAL_COMMUNITY)

## 2016-10-28 DIAGNOSIS — Z79899 Other long term (current) drug therapy: Secondary | ICD-10-CM | POA: Diagnosis not present

## 2016-10-28 DIAGNOSIS — S61213A Laceration without foreign body of left middle finger without damage to nail, initial encounter: Secondary | ICD-10-CM | POA: Diagnosis not present

## 2016-10-28 DIAGNOSIS — Z87891 Personal history of nicotine dependence: Secondary | ICD-10-CM | POA: Insufficient documentation

## 2016-10-28 DIAGNOSIS — W319XXA Contact with unspecified machinery, initial encounter: Secondary | ICD-10-CM | POA: Insufficient documentation

## 2016-10-28 DIAGNOSIS — Y939 Activity, unspecified: Secondary | ICD-10-CM | POA: Diagnosis not present

## 2016-10-28 DIAGNOSIS — J45909 Unspecified asthma, uncomplicated: Secondary | ICD-10-CM | POA: Diagnosis not present

## 2016-10-28 DIAGNOSIS — S61212A Laceration without foreign body of right middle finger without damage to nail, initial encounter: Secondary | ICD-10-CM

## 2016-10-28 DIAGNOSIS — Y990 Civilian activity done for income or pay: Secondary | ICD-10-CM | POA: Diagnosis not present

## 2016-10-28 DIAGNOSIS — S62632B Displaced fracture of distal phalanx of right middle finger, initial encounter for open fracture: Secondary | ICD-10-CM | POA: Insufficient documentation

## 2016-10-28 DIAGNOSIS — S60942A Unspecified superficial injury of right middle finger, initial encounter: Secondary | ICD-10-CM | POA: Diagnosis present

## 2016-10-28 DIAGNOSIS — Y92242 Post office as the place of occurrence of the external cause: Secondary | ICD-10-CM | POA: Diagnosis not present

## 2016-10-28 MED ORDER — TETANUS-DIPHTH-ACELL PERTUSSIS 5-2.5-18.5 LF-MCG/0.5 IM SUSP
0.5000 mL | Freq: Once | INTRAMUSCULAR | Status: AC
Start: 2016-10-28 — End: 2016-10-28
  Administered 2016-10-28: 0.5 mL via INTRAMUSCULAR
  Filled 2016-10-28: qty 0.5

## 2016-10-28 MED ORDER — FENTANYL CITRATE (PF) 100 MCG/2ML IJ SOLN
50.0000 ug | Freq: Once | INTRAMUSCULAR | Status: AC
  Administered 2016-10-28: 50 ug via INTRAVENOUS
  Filled 2016-10-28: qty 2

## 2016-10-28 MED ORDER — LIDOCAINE HCL (PF) 1 % IJ SOLN
30.0000 mL | Freq: Once | INTRAMUSCULAR | Status: AC
  Administered 2016-10-28: 30 mL
  Filled 2016-10-28: qty 30

## 2016-10-28 MED ORDER — OXYCODONE-ACETAMINOPHEN 5-325 MG PO TABS: 1.0000 | ORAL_TABLET | Freq: Four times a day (QID) | ORAL | 0 refills | Status: DC | PRN

## 2016-10-28 MED ORDER — CLINDAMYCIN HCL 300 MG PO CAPS: 300.0000 mg | ORAL_CAPSULE | Freq: Three times a day (TID) | ORAL | 0 refills | Status: AC

## 2016-10-28 MED ORDER — CLINDAMYCIN HCL 300 MG PO CAPS
300.0000 mg | ORAL_CAPSULE | Freq: Once | ORAL | Status: AC
  Administered 2016-10-28: 300 mg via ORAL
  Filled 2016-10-28: qty 1

## 2016-10-28 NOTE — ED Provider Notes (Signed)
[facility] DEPT Provider Note   CSN: [id number] Arrival date & time: 10/28/16  2044     History   Chief Complaint Chief Complaint  Patient presents with  . Finger Injury    right middle    HPI Alicia Wells is a 36 y.o. female.  HPI  36 y.o. female, presents to the Emergency Department today due to right middle finger injury PTA. 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 Notes limited ROM. Pain 10/10. Describes as throbbing sensation. EMS notified and transported to ED. Fentanyl given PTA. Tetanus not UTD. No other symptoms noted.   Past Medical History:  Diagnosis Date  . Allergy   . Anxiety   . Asthma   . Blood transfusion without reported diagnosis   . Depression   . GERD (gastroesophageal reflux disease)   . Headache(784.0)   . History of esophagogastroduodenoscopy (EGD)     Patient Active Problem List   Diagnosis Date Noted  . Low serum vitamin D 06/13/2015  . Dyspnea 02/15/2015  . GERD (gastroesophageal reflux disease) 12/03/2014  . Asthma, moderate persistent 01/23/2013  . Migraine headache 07/29/2012  . Smoker 07/29/2012  . History of depression 07/29/2012    Past Surgical History:  Procedure Laterality Date  . WISDOM TOOTH EXTRACTION      OB History    No data available       Home Medications    Prior to Admission medications   Medication Sig Start Date End Date Taking? Authorizing Provider  escitalopram (LEXAPRO) 5 MG tablet Take 1 tablet (5 mg total) by mouth at bedtime. 10/02/16  Yes Wardell Honour, MD  Multiple Vitamin (MULTIVITAMIN WITH MINERALS) TABS tablet Take 1 tablet by mouth daily.   Yes [provider]  omeprazole (PRILOSEC) 20 MG capsule take 1 capsule by mouth twice a day before A MEAL 02/17/16  Yes Burchette, Alinda Sierras, MD  PROAIR HFA 108 [phone] Base) MCG/ACT inhaler inhale 2 puffs every 6 hours if needed for wheezing shortness of breath 02/17/16  Yes Burchette, Alinda Sierras, MD  ranitidine  (ZANTAC) 150 MG tablet Take 1 tablet (150 mg total) by mouth 2 (two) times daily. Before breakfast and before supper. Patient not taking: Reported on 10/28/2016 06/30/15   Tereasa Coop, PA-C    Family History Family History  Problem Relation Age of Onset  . Diabetes Father   . Asthma Maternal Grandmother   . Asthma Maternal Uncle   . Pancreatic disease Neg Hx   . Colon cancer Neg Hx     Social History Social History  Substance Use Topics  . Smoking status: Former Smoker    Packs/day: 0.25    Years: 15.00    Types: Cigarettes    Start date: 01/12/2002    Quit date: 10/31/2014  . Smokeless tobacco: Never Used  . Alcohol use No     Allergies   Penicillins   Review of Systems Review of Systems ROS reviewed and all are negative for acute change except as noted in the HPI.  Physical Exam Updated Vital Signs BP 110/81 (BP Location: Left Arm)   Pulse 70   Temp (!) 97.5 F (36.4 C) (Oral)   Resp 20   LMP 10/10/2016   SpO2 100%   Physical Exam  Constitutional: She is oriented to person, place, and time. Vital signs are normal. She appears well-developed and well-nourished.  HENT:  Head: Normocephalic.  Right Ear: Hearing normal.  Left Ear: Hearing normal.  Eyes: Pupils are equal, round, and reactive to light. Conjunctivae and EOM are normal.  Cardiovascular: Normal rate and regular rhythm.   Pulmonary/Chest: Effort normal.  Musculoskeletal:  Distal right 3rd digit with deep laceration 3/4 circumference from dorsal aspect. Palmar aspect spared. Distal to DIP. Inferior to nailbed. ROM limited to none. Cap refill <2sec. Join involvement noted.    Neurological: She is alert and oriented to person, place, and time.  Skin: Skin is warm and dry.  Psychiatric: She has a normal mood and affect. Her speech is normal and behavior is normal. Thought content normal.  Nursing note and vitals reviewed.        ED Treatments / Results  Labs (all labs ordered are listed, but  only abnormal results are displayed) Labs Reviewed - No data to display  EKG  EKG Interpretation None       Radiology Dg Hand Complete Right  Result Date: 10/28/2016 CLINICAL DATA:  Smashed right middle finger in machine. Initial encounter. EXAM: RIGHT HAND - COMPLETE 3+ VIEW COMPARISON:  Right hand radiographs from 05/11/2007 FINDINGS: A comminuted displaced fracture is noted at the third distal phalanx, with likely intra-articular extension. Associated soft tissue disruption is noted. Remaining visualized osseous structures are unremarkable. The carpal rows appear grossly intact, and demonstrate normal alignment. IMPRESSION: Comminuted displaced fracture at the third distal phalanx, with likely intra-articular extension. Electronically Signed   By: Garald Balding M.D.   On: 10/28/2016 21:30    Procedures .Nerve Block Date/Time: 10/28/2016 10:26 PM Performed by: Shary Decamp Authorized by: Shary Decamp   Consent:    Consent obtained:  Verbal   Consent given by:  Patient   Risks discussed:  Bleeding, nerve damage and pain Indications:    Indications:  Procedural anesthesia Location:    Body area:  Upper extremity   Upper extremity nerve blocked: Digital nerve Block. Right 3rd digit.   Laterality:  Right Pre-procedure details:    Skin preparation:  Povidone-iodine Procedure details (see MAR for exact dosages):    Block needle gauge:  25 G   Anesthetic injected:  Lidocaine 1% w/o epi   Paresthesia:  Immediately resolved Post-procedure details:    Outcome:  Anesthesia achieved   Patient tolerance of procedure:  Tolerated well, no immediate complications   (including critical care time)  Medications Ordered in ED Medications  lidocaine (PF) (XYLOCAINE) 1 % injection 30 mL (not administered)  Tdap (BOOSTRIX) injection 0.5 mL (0.5 mLs Intramuscular Given 10/28/16 2138)  fentaNYL (SUBLIMAZE) injection 50 mcg (50 mcg Intravenous Given 10/28/16 2138)     Initial Impression /  Assessment and Plan / ED Course  I have reviewed the triage vital signs and the nursing notes.  Pertinent labs & imaging results that were available during my care of the patient were reviewed by me and considered in my medical decision making (see chart for details).  Final Clinical Impressions(s) / ED Diagnoses   {I have reviewed and evaluated the relevant imaging studies.  {I have reviewed the relevant previous healthcare records.  {I obtained HPI from historian.   ED Course:  Assessment: Pt is a 36 y.o. female  presents to the Emergency Department today due to right middle finger injury PTA. 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 Notes limited ROM. Pain 10/10. Describes as throbbing sensation. EMS notified and transported to ED. Fentanyl given PTA. Tetanus not UTD. On exam, pt in NAD. Nontoxic/nonseptic appearing. VSS. Afebrile. Distal right 3rd digit with  deep laceration 3/4 circumference from dorsal aspect. Palmar aspect spared. Distal to DIP. Inferior to nailbed. ROM limited to none. Cap refill <2sec. Join involvement noted. Imaging shows comminuted displaced distal phalanx fracture with intra articular involvement. Given anagelesia in ED and wound cleaned in ED. Consult to [facility] [employer]) who will see in ED and repair with splint application. Appreciate involvement. Plan is to DC home with follow up. Given Rx percocet as well as ABX. ,I have reviewed the [place] Controlled Substance Reporting System. At time of discharge, Patient is in no acute distress. Vital Signs are stable. Patient is able to ambulate. Patient able to tolerate PO.    Disposition/Plan:  DC Home Additional Verbal discharge instructions given and discussed with patient.  Pt Instructed to f/u with Hand on Monday for evaluation and treatment of symptoms. Return precautions given Pt acknowledges and agrees with plan  Supervising Physician Quintella Reichert, MD  Final diagnoses:    Laceration of right middle finger without foreign body without damage to nail, initial encounter  Open displaced fracture of distal phalanx of right middle finger, initial encounter    New Prescriptions New Prescriptions   No medications on file     Conni Slipper 10/28/16 2307    Quintella Reichert, MD 10/30/16 [phone]

## 2016-10-28 NOTE — ED Triage Notes (Signed)
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 Finger has circular laceration around first joint. Pt A&O x 4. Pt given 27mcg Fentanyl prior to arrival.

## 2016-10-28 NOTE — Consult Note (Signed)
Reason for Consult: finger injury Referring Physician: [facility]  CC:I cut my finger  HPI:  Alicia Wells is an 36 y.o. right handed female who presents with near amputation of RLF.  Pt got finger crushed between some metal devices at work this evening.  Pt c/o pain, bleeding, finger deformity and loss of function. Pain is rated at    8/10 and is described as sharp.  Pain is constant.  Pain is made better by rest/immobilization, worse with motion.   Associated signs/symptoms: no other injuries Previous treatment:    Past Medical History:  Diagnosis Date  . Allergy   . Anxiety   . Asthma   . Blood transfusion without reported diagnosis   . Depression   . GERD (gastroesophageal reflux disease)   . Headache(784.0)   . History of esophagogastroduodenoscopy (EGD)     Past Surgical History:  Procedure Laterality Date  . WISDOM TOOTH EXTRACTION      Family History  Problem Relation Age of Onset  . Diabetes Father   . Asthma Maternal Grandmother   . Asthma Maternal Uncle   . Pancreatic disease Neg Hx   . Colon cancer Neg Hx     Social History:  reports that she quit smoking about 1 years ago. Her smoking use included Cigarettes. She started smoking about 14 years ago. She has a 3.75 pack-year smoking history. She has never used smokeless tobacco. She reports that she does not drink alcohol or use drugs.  Allergies:  Allergies  Allergen Reactions  . Penicillins Anaphylaxis    Rash, hives Has patient had a PCN reaction causing immediate rash, facial/tongue/throat swelling, SOB or lightheadedness with hypotension:  yes Has patient had a PCN reaction causing severe rash involving mucus membranes or skin necrosis:Unknown Has patient had a PCN reaction that required hospitalization: no Has patient had a PCN reaction occurring within the last 10 years: no If all of the above answers are "NO", then may proceed with Cephalosporin use.     Medications: I have reviewed the patient's  current medications.  No results found for this or any previous visit (from the past 48 hour(s)).  Dg Hand Complete Right  Result Date: 10/28/2016 CLINICAL DATA:  Smashed right middle finger in machine. Initial encounter. EXAM: RIGHT HAND - COMPLETE 3+ VIEW COMPARISON:  Right hand radiographs from 05/11/2007 FINDINGS: A comminuted displaced fracture is noted at the third distal phalanx, with likely intra-articular extension. Associated soft tissue disruption is noted. Remaining visualized osseous structures are unremarkable. The carpal rows appear grossly intact, and demonstrate normal alignment. IMPRESSION: Comminuted displaced fracture at the third distal phalanx, with likely intra-articular extension. Electronically Signed   By: Garald Balding M.D.   On: 10/28/2016 21:30    Pertinent items are noted in HPI. Temp:  [97.5 F (36.4 C)] 97.5 F (36.4 C) (08/08 2056) Pulse Rate:  [66-70] 70 (08/08 2056) Resp:  [20] 20 (08/08 2056) BP: (110)/(81) 110/81 (08/08 2056) SpO2:  [100 %] 100 % (08/08 2056) General appearance: alert and cooperative Resp: clear to auscultation bilaterally Cardio: regular rate and rhythm GI: soft, non-tender; bowel sounds normal; no masses,  no organomegaly Extremities: extremities normal, atraumatic, no cyanosis or edema  Except for RRF with near amputation, open fracture at proximal nail with associated laceration both radially and ulnarly, nail laceration Assessment: Open fracture RRF, near amputation, nail bed injury Plan: Needs washout and repair I have discussed this treatment plan in detail with patient , including the risks of the  recommended treatment or surgery, the benefits and the alternatives.  The patient  understands that additional treatment may be necessary.  Procedure: RRF anesthetized with 1% lidocaine w/o epi; wound cleansed and non viable skin was debrided, the nail plate was partially removed.  Each radial and ulnar laceration was repaired with  5-0 nylon, the nail matrix was sutured beneath what was left of the eponychial fold.  The fracture was reduced. A sterile dressing and splint were placed.  The patient tolerated the procedure well.  Alicia Wells C Alicia Wells 10/28/2016, 10:55 PM

## 2016-10-28 NOTE — Telephone Encounter (Signed)
Dr Tamala Julian stated that she does have the pt's paperwork and she will complete them and return them to me to be processed.

## 2016-10-28 NOTE — Discharge Instructions (Addendum)
Discharge Instructions:  Keep your dressing clean, dry and in place until instructed to remove by Dr. Coley.  If the dressing becomes dirty or wet call the office for instructions during business hours. Elevate the extremity to help with swelling, this will also help with any discomfort. Take your medication as prescribed. No lifting with the injured  extremity. If you feel that the dressing is too tight, you may loosen it, but keep it on; finger tips should be pink; if there is a concern, call the office. [phone] Ice may be used if the injury is a fracture, do not apply ice directly to the skin. Please call the office on the next business day after discharge to arrange a follow up appointment.  Call [phone] between the hours of 9am - 5pm M-Th or 9am - 1pm on Fri. For most hand injuries and/or conditions, you may return to work using the uninjured hand (one handed duty) within 24-72 hours.  A detailed note will be provided to you at your follow up appointment or may contact the office prior to your follow up.    

## 2016-10-30 NOTE — Telephone Encounter (Signed)
Still have not seen these forms did you get them completed?

## 2016-11-03 NOTE — Telephone Encounter (Signed)
Patient came in wanting the paperwork that she gave you on 10/02/16 that was for [facility]. I have not gotten these forms and patients states she needs them ASAP can we please locate these forms and get them to me.

## 2016-11-04 ENCOUNTER — Ambulatory Visit: Payer: 59 | Admitting: Family Medicine

## 2016-11-05 NOTE — Telephone Encounter (Signed)
Forms completed and faxed to employer; forms placed on disability desk for processing.  Also, patient is due for one month follow-up.  Please schedule her with me in upcoming 1-2 weeks.  I do work Saturday as not to interfere with her work schedule.

## 2016-11-06 NOTE — Telephone Encounter (Signed)
Paperwork scanned and faxed on 11/06/16.

## 2017-05-27 ENCOUNTER — Telehealth: Payer: Self-pay | Admitting: Family Medicine

## 2017-05-27 NOTE — Telephone Encounter (Signed)
Copied from [place]. Topic: Quick Communication - See Telephone Encounter >> May 27, 2017  3:35 PM Ivar Drape wrote: CRM for notification. See Telephone encounter for:  05/27/17. Patient would like the following prescriptions refilled: 1) omeprazole (PRILOSEC) 20 MG capsule  2) PROAIR HFA 108 (90 Base) MCG/ACT inhaler.  Please send to her preferred pharmacist from now on: [facility] [address].

## 2017-05-28 NOTE — Telephone Encounter (Signed)
Left message on machine for patient to return our call. Patient will need an office for refills.  Last office visit 06/13/15  CRM created

## 2017-05-28 NOTE — Telephone Encounter (Addendum)
Pt requesting refills on omeprazole and proair hfa. Both prescriptions  these meds expired on 02/16/17.   Please review.  PCP: Carolann Littler  LOV  10/02/16  Pharmacy: [facility] on  [address], [employer] [place]

## 2017-05-31 NOTE — Telephone Encounter (Signed)
Appointment made

## 2017-06-14 ENCOUNTER — Ambulatory Visit (INDEPENDENT_AMBULATORY_CARE_PROVIDER_SITE_OTHER): Payer: 59 | Admitting: Family Medicine

## 2017-06-14 ENCOUNTER — Encounter: Payer: Self-pay | Admitting: Family Medicine

## 2017-06-14 VITALS — BP 102/64 | HR 90 | Temp 98.2°F | Wt 140.9 lb

## 2017-06-14 DIAGNOSIS — K219 Gastro-esophageal reflux disease without esophagitis: Secondary | ICD-10-CM

## 2017-06-14 DIAGNOSIS — Z8659 Personal history of other mental and behavioral disorders: Secondary | ICD-10-CM

## 2017-06-14 DIAGNOSIS — J4521 Mild intermittent asthma with (acute) exacerbation: Secondary | ICD-10-CM

## 2017-06-14 MED ORDER — ESCITALOPRAM OXALATE 5 MG PO TABS: 5.0000 mg | ORAL_TABLET | Freq: Every day | ORAL | 3 refills | Status: DC

## 2017-06-14 MED ORDER — ALBUTEROL SULFATE HFA 108 (90 BASE) MCG/ACT IN AERS: INHALATION_SPRAY | RESPIRATORY_TRACT | 2 refills | Status: DC

## 2017-06-14 MED ORDER — OMEPRAZOLE 20 MG PO CPDR: 20.0000 mg | DELAYED_RELEASE_CAPSULE | Freq: Every day | ORAL | 3 refills | Status: DC

## 2017-06-14 NOTE — Patient Instructions (Signed)
Set up complete physical at some point this year.   

## 2017-06-14 NOTE — Progress Notes (Signed)
Subjective:     Patient ID: Alicia Wells, female   DOB: January 27, 1981, 37 y.o.   MRN: [id number]  HPI Patient seen for medical follow-up.  Mild intermittent asthma. She states she wheezes very infrequently. Uses albuterol as needed. Wheezing tends to occur more at night. She had diagnosis initially of moderate persistent asthma but sounds  more like mild intermittent. Requesting refill pro-air  History of GERD. She's had history of globus type symptoms in the past but currently very stable on omeprazole 20 mg daily. She's tried a couple occasions to discontinue but has had breakthrough symptoms. Requesting refill  Third issue is past history of some anxiety and mild depression. She was on Lexapro 5 mg daily and doing well. She is requesting refills.  Past Medical History:  Diagnosis Date  . Allergy   . Anxiety   . Asthma   . Blood transfusion without reported diagnosis   . Depression   . GERD (gastroesophageal reflux disease)   . Headache(784.0)   . History of esophagogastroduodenoscopy (EGD)    Past Surgical History:  Procedure Laterality Date  . WISDOM TOOTH EXTRACTION      reports that she quit smoking about 2 years ago. Her smoking use included cigarettes. She started smoking about 15 years ago. She has a 3.75 pack-year smoking history. She has never used smokeless tobacco. She reports that she does not drink alcohol or use drugs. family history includes Asthma in her maternal grandmother and maternal uncle; Diabetes in her father. Allergies  Allergen Reactions  . Penicillins Anaphylaxis    Rash, hives Has patient had a PCN reaction causing immediate rash, facial/tongue/throat swelling, SOB or lightheadedness with hypotension:  yes Has patient had a PCN reaction causing severe rash involving mucus membranes or skin necrosis:Unknown Has patient had a PCN reaction that required hospitalization: no Has patient had a PCN reaction occurring within the last 10 years: no If all of  the above answers are "NO", then may proceed with Cephalosporin use.      Review of Systems  Constitutional: Negative for appetite change and unexpected weight change.  Respiratory: Negative for shortness of breath.   Cardiovascular: Negative for chest pain.  Gastrointestinal: Negative for abdominal pain and blood in stool.  Genitourinary: Negative for dysuria.  Neurological: Negative for dizziness and weakness.  Hematological: Negative for adenopathy.  Psychiatric/Behavioral: Negative for agitation, sleep disturbance and suicidal ideas. The patient is nervous/anxious.        Objective:   Physical Exam  Constitutional: She is oriented to person, place, and time. She appears well-developed and well-nourished.  Neck: Neck supple. No thyromegaly present.  Cardiovascular: Normal rate and regular rhythm.  Pulmonary/Chest: Effort normal and breath sounds normal. No respiratory distress. She has no wheezes. She has no rales.  Neurological: She is alert and oriented to person, place, and time. No cranial nerve deficit.  Psychiatric: She has a normal mood and affect. Her behavior is normal. Judgment and thought content normal.       Assessment:     #1 mild intermittent asthma-stable  #2 GERD currently stable on low-dose omeprazole  #3 history of anxiety and mild depression    Plan:     -Refill medications including pro-air, omeprazole, and Lexapro -We recommend she set up complete physical as she's not had one in several years  Eulas Post MD [facility]

## 2017-08-04 ENCOUNTER — Telehealth: Payer: Self-pay | Admitting: Family Medicine

## 2017-08-04 NOTE — Telephone Encounter (Signed)
Copied from [place] [id number]. Topic: Quick Communication - See Telephone Encounter >> Aug 04, 2017  3:45 PM Rutherford Nail, [place] wrote: CRM for notification. See Telephone encounter for: 08/04/17. Patient calling to see if she could take the escitalopram (LEXAPRO) 5 MG tablet during the day instead of at bedtime? Please advise. CB#: [phone]

## 2017-08-05 NOTE — Telephone Encounter (Signed)
Yes..  OK to take in AM with breakfast- just be consistent taking same time of day.

## 2017-08-05 NOTE — Telephone Encounter (Signed)
I left a detailed message with the information below at the pts cell number. 

## 2017-08-12 ENCOUNTER — Encounter: Payer: Self-pay | Admitting: Family Medicine

## 2018-05-10 ENCOUNTER — Other Ambulatory Visit: Payer: Self-pay | Admitting: Family Medicine

## 2018-06-12 ENCOUNTER — Other Ambulatory Visit: Payer: Self-pay | Admitting: Family Medicine

## 2018-06-13 ENCOUNTER — Telehealth: Payer: Self-pay | Admitting: Family Medicine

## 2018-06-13 NOTE — Telephone Encounter (Signed)
Patient called requesting omeprazole refill.  Advised patient refill was sent to pharmacy and that she needs to make an appointment to get further refills.  Patient will call back to schedule appointment.

## 2018-06-17 ENCOUNTER — Telehealth: Payer: Self-pay

## 2018-06-17 NOTE — Telephone Encounter (Signed)
Alicia Wells called to make an appointment to see Dr. Elease Hashimoto. She called her daughter Alicia Wells and we spoke and made an appointment on Monday at 9:30am for a WebEx appointment. Patient verbalized an understanding.

## 2018-06-20 ENCOUNTER — Ambulatory Visit (INDEPENDENT_AMBULATORY_CARE_PROVIDER_SITE_OTHER): Payer: 59 | Admitting: Family Medicine

## 2018-06-20 ENCOUNTER — Other Ambulatory Visit: Payer: Self-pay

## 2018-06-20 DIAGNOSIS — L509 Urticaria, unspecified: Secondary | ICD-10-CM | POA: Diagnosis not present

## 2018-06-20 NOTE — Progress Notes (Signed)
Patient ID: Alicia Wells, female   DOB: Jan 03, 1981, 38 y.o.   MRN: [id number]  Virtual Visit via Video Note  I connected with Malachi Pro on 06/20/18 at  9:30 AM EDT by a video enabled telemedicine application and verified that I am speaking with the correct person using two identifiers.  Location patient: home Location provider:work or home office Persons participating in the virtual visit: patient, provider  I discussed the limitations of evaluation and management by telemedicine and the availability of in person appointments. The patient expressed understanding and agreed to proceed.   HPI: Patient is seen via video encounter with complaints of rash over the past several days.  She started several over-the-counter supplements in order to try to "increase her immune system".  1 of these included black seed oil which she started 2 weeks ago.  She started a couple other natural supplements as well.  She broke out several days ago and what sounds like whelps on her trunk and extremities.  She also describes somewhat of a tingling sensation throughout her body which correlated with the rash.  No angioedema symptoms.  No dyspnea.  Symptoms are mild to moderate.  She tried topical cortisone cream without much improvement.  She tried over-the-counter Benadryl.  She works at the [employer].  She denies any cough or dyspnea.  No known sick contacts.  No change of soap or detergent.  No known food allergies   ROS: See pertinent positives and negatives per HPI.  Past Medical History:  Diagnosis Date  . Allergy   . Anxiety   . Asthma   . Blood transfusion without reported diagnosis   . Depression   . GERD (gastroesophageal reflux disease)   . Headache(784.0)   . History of esophagogastroduodenoscopy (EGD)     Past Surgical History:  Procedure Laterality Date  . WISDOM TOOTH EXTRACTION      Family History  Problem Relation Age of Onset  . Diabetes Father   . Asthma Maternal  Grandmother   . Asthma Maternal Uncle   . Pancreatic disease Neg Hx   . Colon cancer Neg Hx     SOCIAL HX: Non-smoker.  No regular alcohol use.  Works at the post office.   Current Outpatient Medications:  .  albuterol (PROAIR HFA) 108 (90 Base) MCG/ACT inhaler, inhale 2 puffs every 6 hours if needed for wheezing shortness of breath, Disp: 18 Inhaler, Rfl: 2 .  escitalopram (LEXAPRO) 5 MG tablet, Take 1 tablet (5 mg total) by mouth at bedtime., Disp: 90 tablet, Rfl: 3 .  Multiple Vitamin (MULTIVITAMIN WITH MINERALS) TABS tablet, Take 1 tablet by mouth daily., Disp: , Rfl:  .  omeprazole (PRILOSEC) 20 MG capsule, TAKE 1 CAPSULE BY MOUTH EVERY DAY, Disp: 30 capsule, Rfl: 0  EXAM:  VITALS per patient if applicable:  GENERAL: alert, oriented, appears well and in no acute distress  HEENT: atraumatic, conjunttiva clear, no obvious abnormalities on inspection of external nose and ears  NECK: normal movements of the head and neck  LUNGS: on inspection no signs of respiratory distress, breathing rate appears normal, no obvious gross SOB, gasping or wheezing  CV: no obvious cyanosis  MS: moves all visible extremities without noticeable abnormality  PSYCH/NEURO: pleasant and cooperative, no obvious depression or anxiety, speech and thought processing grossly intact  ASSESSMENT AND PLAN:  Discussed the following assessment and plan:  Urticaria.  Unknown trigger.  Possibly related to recent new supplement.  -Recommend leave off all over-the-counter supplements at  this time -Continue Benadryl at night and try Zyrtec or Allegra during daytime hours -Also consider supplement with Pepcid 20 mg twice daily -She is aware that urticaria may take several days to resolve -Touch base by end of week if urticarial lesions not improving    I discussed the assessment and treatment plan with the patient. The patient was provided an opportunity to ask questions and all were answered. The patient  agreed with the plan and demonstrated an understanding of the instructions.   The patient was advised to call back or seek an in-person evaluation if the symptoms worsen or if the condition fails to improve as anticipated.  I provided 15 minutes of non-face-to-face time during this encounter.   Carolann Littler, MD

## 2018-06-22 ENCOUNTER — Telehealth: Payer: Self-pay | Admitting: Family Medicine

## 2018-06-22 NOTE — Telephone Encounter (Signed)
Please advise 

## 2018-06-22 NOTE — Telephone Encounter (Signed)
Called patient back and gave her message from Dr. Elease Hashimoto. Patient verbalized an understanding.

## 2018-06-22 NOTE — Telephone Encounter (Signed)
I think basic vitamins would be OK.

## 2018-06-22 NOTE — Telephone Encounter (Signed)
Copied from [place] [id number]. Topic: Quick Communication - Rx Refill/Question >> Jun 22, 2018  9:22 AM Reyne Dumas L wrote: Medication:   Pt called and left message on [place].  States that she forgot to ask at visit if it is okay for her to restart vitamins. Pt can be reached at [phone].

## 2018-07-11 ENCOUNTER — Ambulatory Visit: Payer: Self-pay | Admitting: Family Medicine

## 2018-07-11 NOTE — Telephone Encounter (Signed)
Pt scheduled for OV with PCP.

## 2018-07-11 NOTE — Telephone Encounter (Signed)
Pt.'s Mom called to report pt. Is still having a rash on and off. "Whelps on her arms, legs and chest that sometimes just run into each other." Taking Benadryl without relief. 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 Warm transfer to [facility] in the practice for an appointment.  Answer Assessment - Initial Assessment Questions 1. APPEARANCE of RASH: "Describe the rash." (e.g., spots, blisters, raised areas, skin peeling, scaly)     Whelps 2. SIZE: "How big are the spots?" (e.g., tip of pen, eraser, coin; inches, centimeters)     They had all run together 3. LOCATION: "Where is the rash located?"     Arms, legs and trunk 4. COLOR: "What color is the rash?" (Note: It is difficult to assess rash color in people with darker-colored skin. When this situation occurs, simply ask the caller to describe what they see.)     Red 5. ONSET: "When did the rash begin?"     The end of March 6. FEVER: "Do you have a fever?" If so, ask: "What is your temperature, how was it measured, and when did it start?"     No 7. ITCHING: "Does the rash itch?" If so, ask: "How bad is the itch?" (Scale 1-10; or mild, moderate, severe)     Moderate 8. CAUSE: "What do you think is causing the rash?"     Unsure 9. MEDICATION FACTORS: "Have you started any new medications within the last 2 weeks?" (e.g., antibiotics)      No 10. OTHER SYMPTOMS: "Do you have any other symptoms?" (e.g., dizziness, headache, sore throat, joint pain)       no 11. PREGNANCY: "Is there any chance you are pregnant?" "When was your last menstrual period?"       No  Protocols used: RASH OR REDNESS - [facility]

## 2018-07-12 ENCOUNTER — Other Ambulatory Visit: Payer: Self-pay

## 2018-07-12 ENCOUNTER — Ambulatory Visit (INDEPENDENT_AMBULATORY_CARE_PROVIDER_SITE_OTHER): Payer: 59 | Admitting: Family Medicine

## 2018-07-12 DIAGNOSIS — L509 Urticaria, unspecified: Secondary | ICD-10-CM | POA: Diagnosis not present

## 2018-07-12 NOTE — Progress Notes (Signed)
Patient ID: Alicia Wells, female   DOB: 05-03-1980, 38 y.o.   MRN: [id number]  Virtual Visit via Video Note  I connected with Malachi Pro on 07/12/18 at  8:45 AM EDT by a video enabled telemedicine application and verified that I am speaking with the correct person using two identifiers.  Location patient: home Location provider:work or home office Persons participating in the virtual visit: patient, provider  I discussed the limitations of evaluation and management by telemedicine and the availability of in person appointments. The patient expressed understanding and agreed to proceed.   HPI:   Patient had connected with [place] for video visit on 06/20/2018.  She described what sounded like urticaria.  She continues to have some intermittent urticaria which is mostly upper extremities.  She is not aware of any chemical exposures.  She has tried discontinuation of all natural supplements and also change of detergents and soaps without improvement.  Benadryl at night helps.  She tried some Pepcid which did not seem to help.  We had suggested nonsedating daytime antihistamine such as Zyrtec or Allegra but she never started that.  She went to urgent care couple weeks ago and was prescribed prednisone but she never started that.  She thinks she has had allergy testing previously.  She states she is had positive testing to several things including peanuts.  She tries to avoid that.  She is not aware of any allergies to medications.  She does not describe any angioedema such as lip or tongue swelling or any extremity swelling.  Denies any urticaria on her legs or trunk   ROS: See pertinent positives and negatives per HPI.  Past Medical History:  Diagnosis Date  . Allergy   . Anxiety   . Asthma   . Blood transfusion without reported diagnosis   . Depression   . GERD (gastroesophageal reflux disease)   . Headache(784.0)   . History of esophagogastroduodenoscopy (EGD)     Past Surgical  History:  Procedure Laterality Date  . WISDOM TOOTH EXTRACTION      Family History  Problem Relation Age of Onset  . Diabetes Father   . Asthma Maternal Grandmother   . Asthma Maternal Uncle   . Pancreatic disease Neg Hx   . Colon cancer Neg Hx     SOCIAL HX: Quit smoking 2016.  Works for the post office   Current Outpatient Medications:  .  albuterol (PROAIR HFA) 108 (90 Base) MCG/ACT inhaler, inhale 2 puffs every 6 hours if needed for wheezing shortness of breath, Disp: 18 Inhaler, Rfl: 2 .  escitalopram (LEXAPRO) 5 MG tablet, Take 1 tablet (5 mg total) by mouth at bedtime., Disp: 90 tablet, Rfl: 3 .  Multiple Vitamin (MULTIVITAMIN WITH MINERALS) TABS tablet, Take 1 tablet by mouth daily., Disp: , Rfl:  .  omeprazole (PRILOSEC) 20 MG capsule, TAKE 1 CAPSULE BY MOUTH EVERY DAY, Disp: 30 capsule, Rfl: 0  EXAM:  VITALS per patient if applicable:  GENERAL: alert, oriented, appears well and in no acute distress  HEENT: atraumatic, conjunttiva clear, no obvious abnormalities on inspection of external nose and ears  NECK: normal movements of the head and neck  LUNGS: on inspection no signs of respiratory distress, breathing rate appears normal, no obvious gross SOB, gasping or wheezing  CV: no obvious cyanosis  MS: moves all visible extremities without noticeable abnormality  PSYCH/NEURO: pleasant and cooperative, no obvious depression or anxiety, speech and thought processing grossly intact  ASSESSMENT AND PLAN:  Discussed  the following assessment and plan:  Urticaria.  Trigger/etiology unclear -Add Zyrtec, Allegra, or Xyzal once daily and continue Benadryl at night -We discussed various potential triggers for urticaria including even stress. -Try to avoid prednisone if possible -May need further allergy testing or repeat allergy testing but will wait until current pandemic has passed     I discussed the assessment and treatment plan with the patient. The patient was  provided an opportunity to ask questions and all were answered. The patient agreed with the plan and demonstrated an understanding of the instructions.   The patient was advised to call back or seek an in-person evaluation if the symptoms worsen or if the condition fails to improve as anticipated   Carolann Littler, MD

## 2018-09-19 IMAGING — DX DG HAND COMPLETE 3+V*R*
3 series · 3 of 3 positions shown · non-contrast
Comparison: Right hand radiographs from 05/11/2007

CLINICAL DATA: Smashed right middle finger in machine. Initial
encounter.

EXAM:
RIGHT HAND - COMPLETE 3+ VIEW

[hand ap]
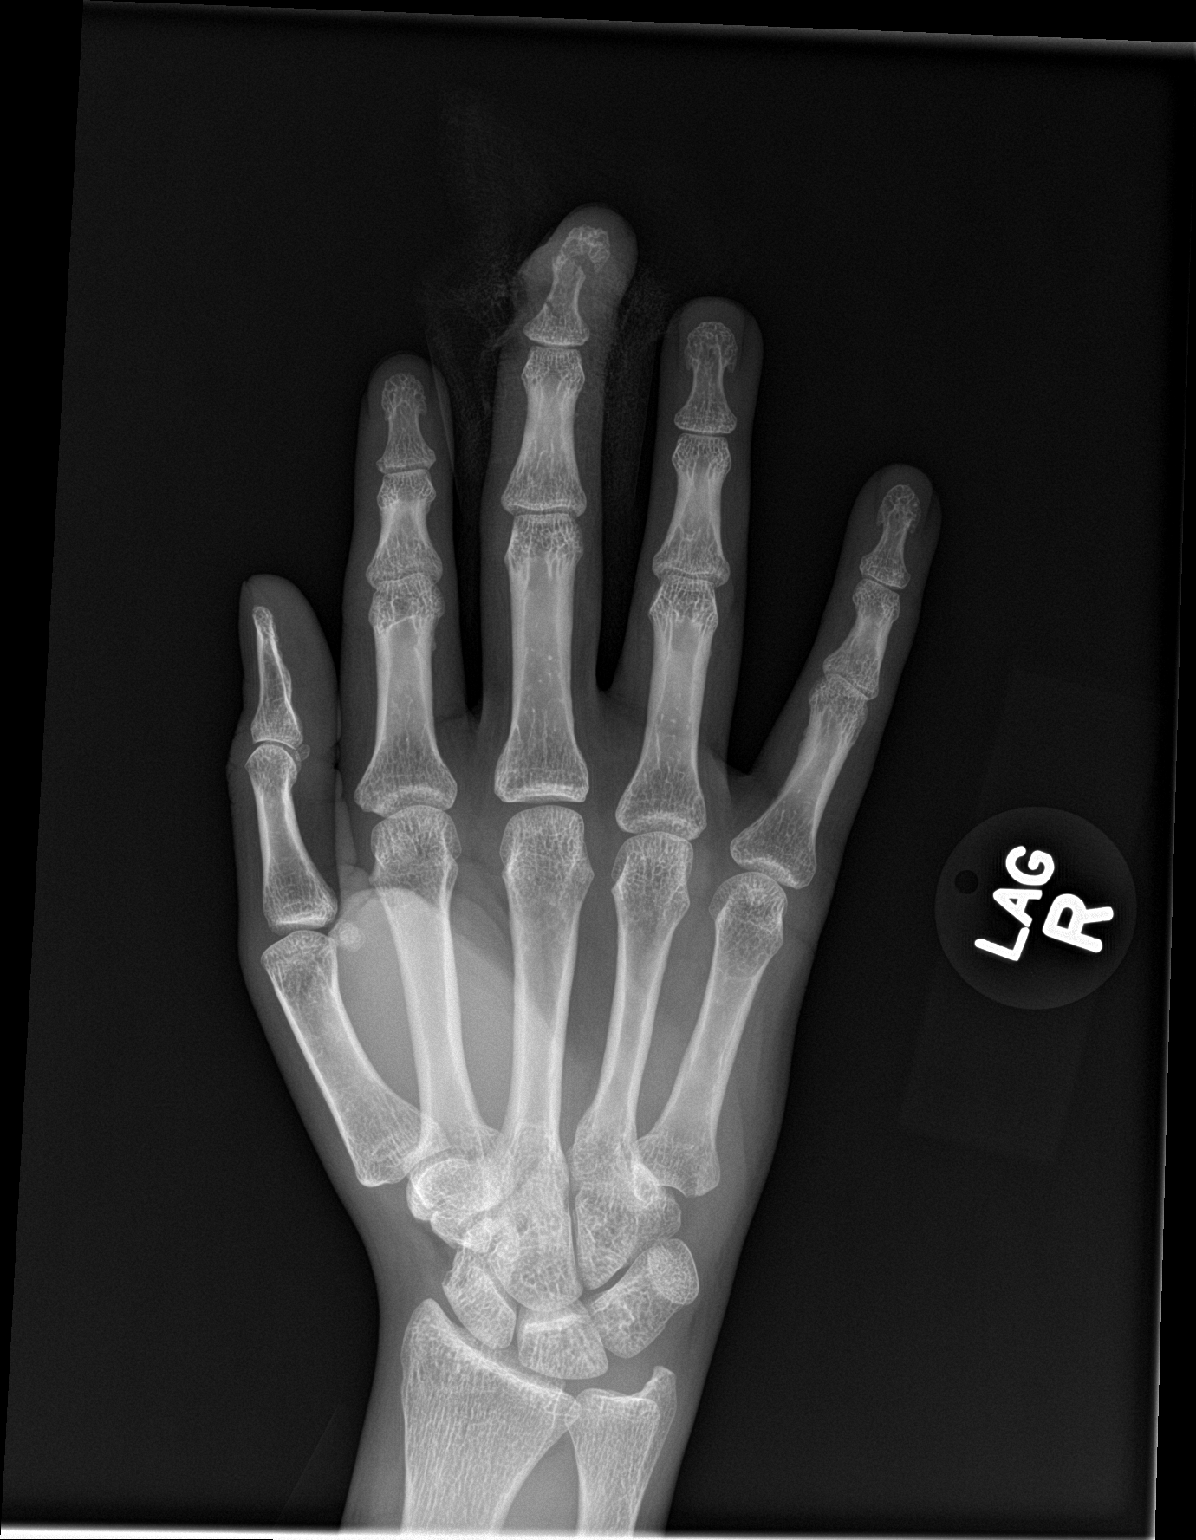

[hand obl]
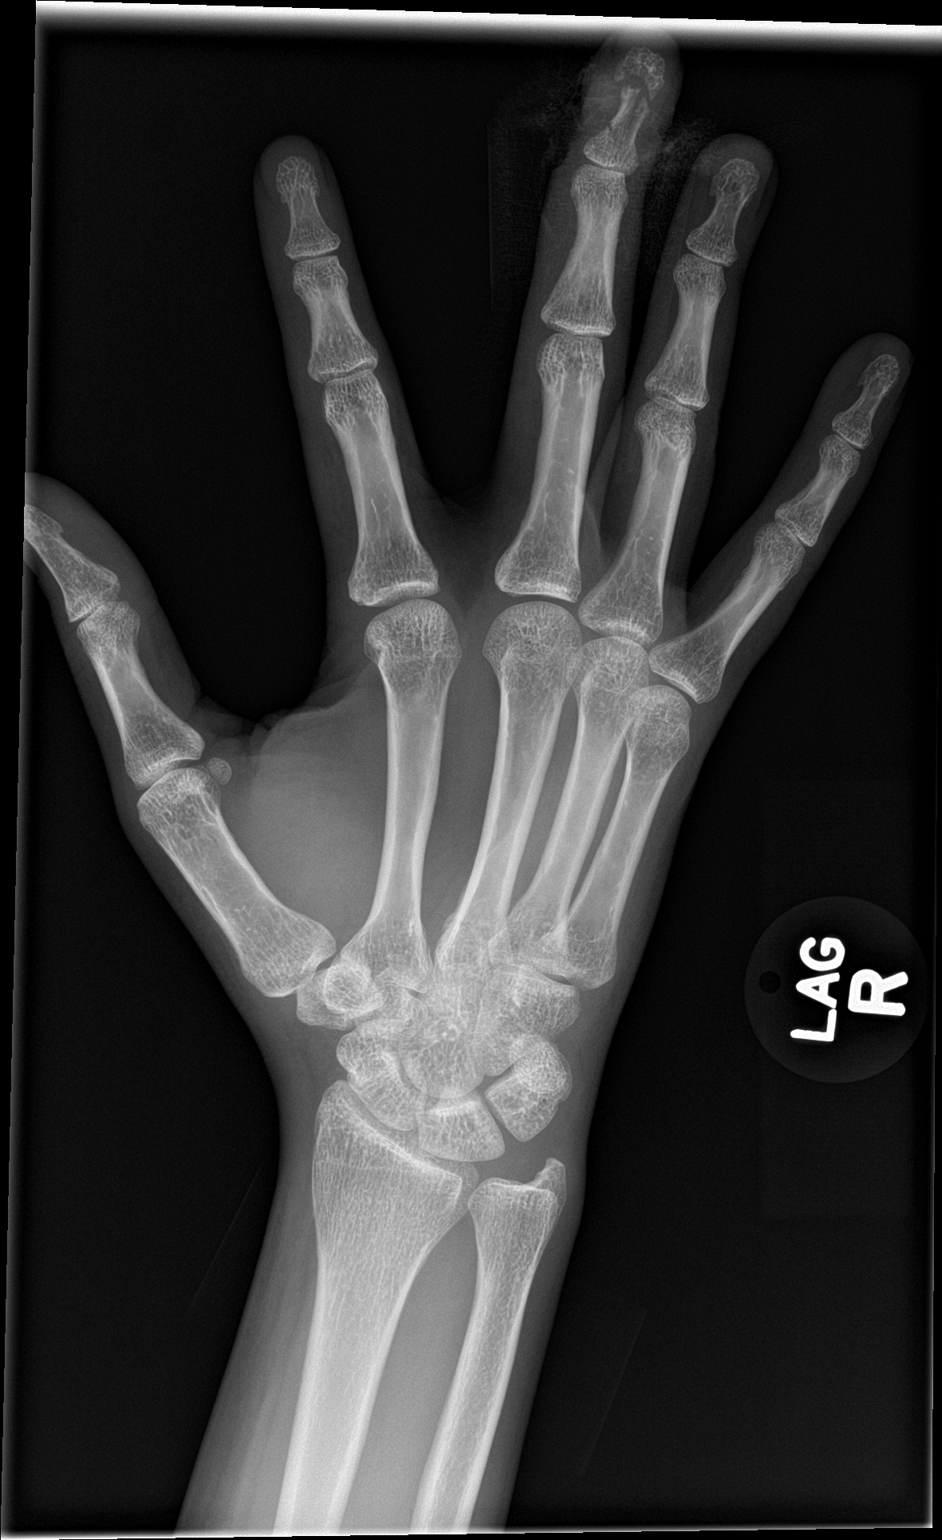

[hand lat]
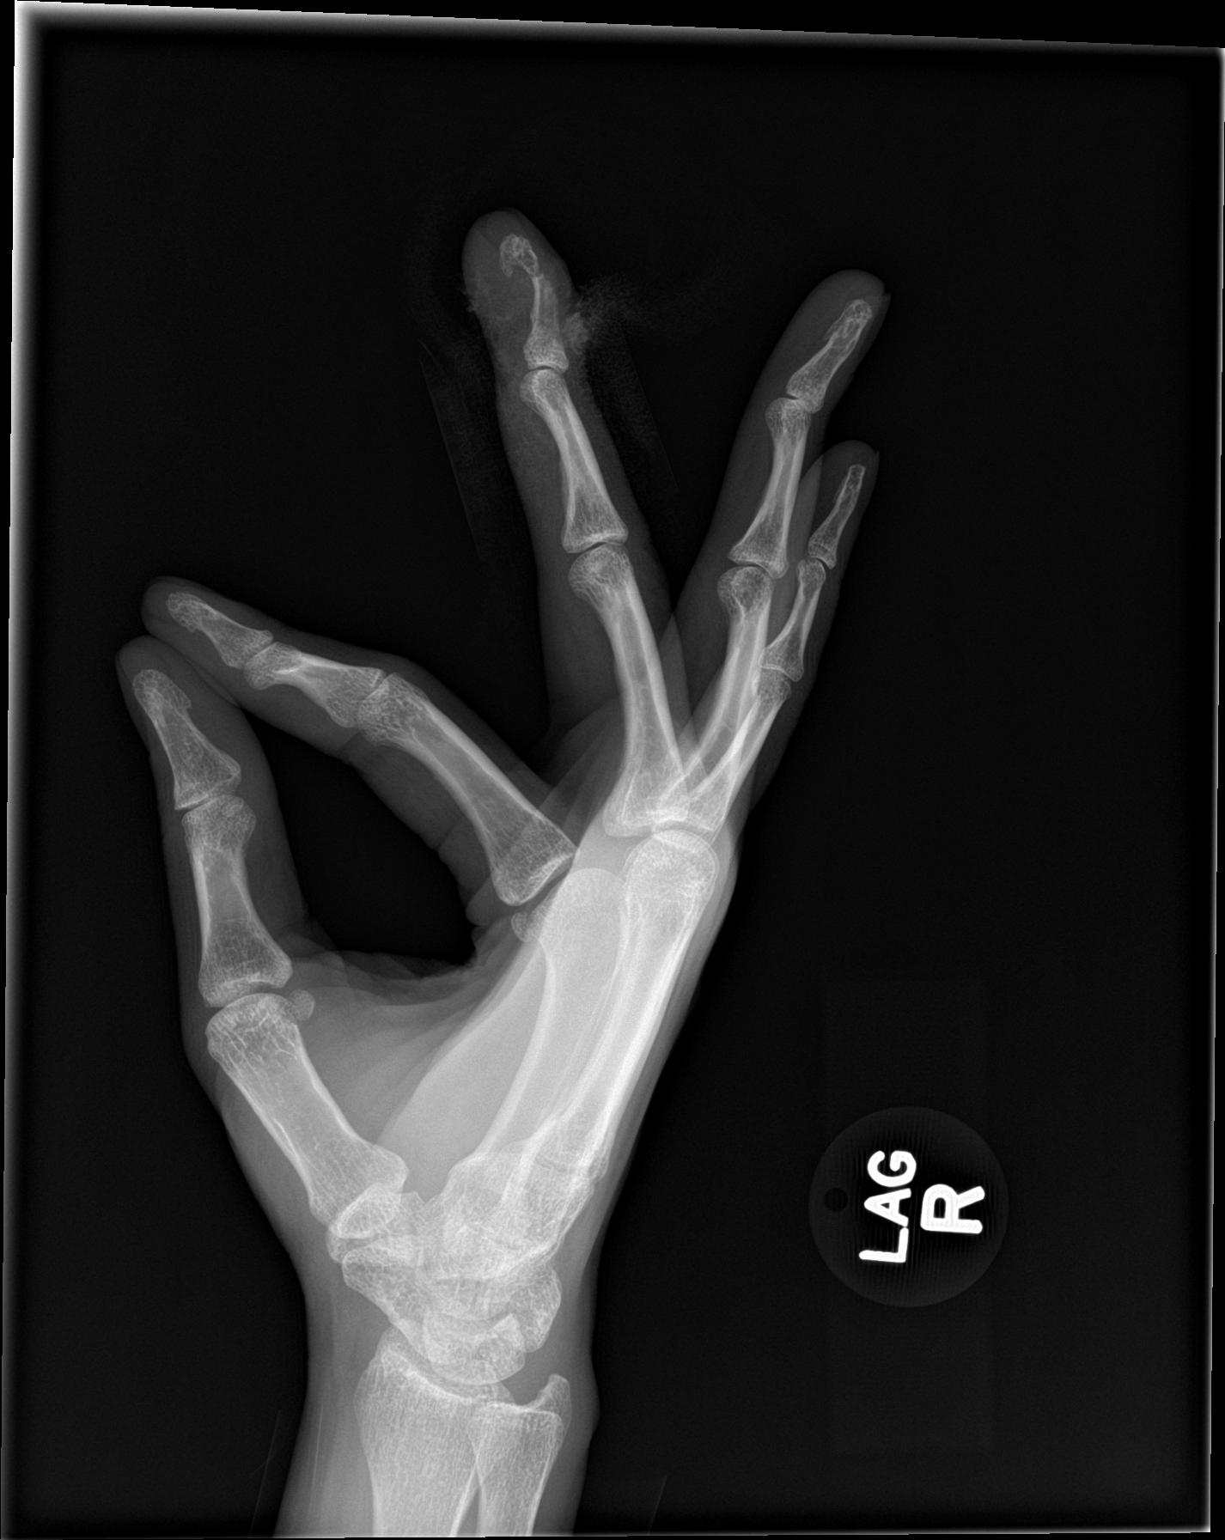

[3 of 3 positions shown; findings below may reference images not displayed]

FINDINGS: A comminuted displaced fracture is noted at the third distal
phalanx, with likely intra-articular extension. Associated soft
tissue disruption is noted. Remaining visualized osseous structures
are unremarkable. The carpal rows appear grossly intact, and
demonstrate normal alignment.
IMPRESSION: Comminuted displaced fracture at the third distal phalanx, with
likely intra-articular extension.

## 2018-10-30 ENCOUNTER — Ambulatory Visit (HOSPITAL_COMMUNITY)
Admission: EM | Admit: 2018-10-30 | Discharge: 2018-10-30 | Disposition: A | Discharge status: Home / Self Care | Payer: 59 | Attending: Family Medicine | Admitting: Family Medicine

## 2018-10-30 ENCOUNTER — Encounter (HOSPITAL_COMMUNITY): Payer: Self-pay | Admitting: Family Medicine

## 2018-10-30 ENCOUNTER — Other Ambulatory Visit: Payer: Self-pay

## 2018-10-30 DIAGNOSIS — Z833 Family history of diabetes mellitus: Secondary | ICD-10-CM | POA: Diagnosis not present

## 2018-10-30 DIAGNOSIS — H8110 Benign paroxysmal vertigo, unspecified ear: Secondary | ICD-10-CM | POA: Diagnosis present

## 2018-10-30 LAB — CBC
HCT: 38.7 % (ref 36.0–46.0)
Hemoglobin: 13.1 g/dL (ref 12.0–15.0)
MCH: 29.9 pg (ref 26.0–34.0)
MCHC: 33.9 g/dL (ref 30.0–36.0)
MCV: 88.4 fL (ref 80.0–100.0)
Platelets: 304 10*3/uL (ref 150–400)
RBC: 4.38 MIL/uL (ref 3.87–5.11)
RDW: 13.8 % (ref 11.5–15.5)
WBC: 7.1 10*3/uL (ref 4.0–10.5)
nRBC: 0 % (ref 0.0–0.2)

## 2018-10-30 LAB — GLUCOSE, CAPILLARY: Glucose-Capillary: 89 mg/dL (ref 70–99)

## 2018-10-30 MED ORDER — MECLIZINE HCL 12.5 MG PO TABS: 12.5000 mg | ORAL_TABLET | Freq: Three times a day (TID) | ORAL | 0 refills | Status: DC | PRN

## 2018-10-30 NOTE — ED Provider Notes (Signed)
[place]    CSN: [id number] Arrival date & time: 10/30/18  1108     History   Chief Complaint Chief Complaint  Patient presents with  . Dizziness    HPI Alicia Wells is a 38 y.o. female.   Established [facility] patient presenting with dizziness.  Symptoms began about a week ago and intensity has varied.  No headache or fever, no stiff neck or prior hx of these symptoms, no head trauma  Patient describes mild vertigo, worse with head movement.  She has had some heavy periods and worries about diabetes.  No nausea or vomiting, no diarrhea.     Past Medical History:  Diagnosis Date  . Allergy   . Anxiety   . Asthma   . Blood transfusion without reported diagnosis   . Depression   . GERD (gastroesophageal reflux disease)   . Headache(784.0)   . History of esophagogastroduodenoscopy (EGD)     Patient Active Problem List   Diagnosis Date Noted  . Low serum vitamin D 06/13/2015  . Dyspnea 02/15/2015  . GERD (gastroesophageal reflux disease) 12/03/2014  . Asthma, mild intermittent 01/23/2013  . Migraine headache 07/29/2012  . Smoker 07/29/2012  . History of depression 07/29/2012    Past Surgical History:  Procedure Laterality Date  . WISDOM TOOTH EXTRACTION      OB History   No obstetric history on file.      Home Medications    Prior to Admission medications   Medication Sig Start Date End Date Taking? Authorizing Provider  albuterol (PROAIR HFA) 108 (90 Base) MCG/ACT inhaler inhale 2 puffs every 6 hours if needed for wheezing shortness of breath 06/14/17   Burchette, Alinda Sierras, MD  escitalopram (LEXAPRO) 5 MG tablet Take 1 tablet (5 mg total) by mouth at bedtime. 06/14/17   Burchette, Alinda Sierras, MD  meclizine (ANTIVERT) 12.5 MG tablet Take 1 tablet (12.5 mg total) by mouth 3 (three) times daily as needed for dizziness. 10/30/18   Robyn Haber, MD  Multiple Vitamin (MULTIVITAMIN WITH MINERALS) TABS tablet Take 1 tablet by mouth daily.     [provider]  omeprazole (PRILOSEC) 20 MG capsule TAKE 1 CAPSULE BY MOUTH EVERY DAY 06/13/18   Burchette, Alinda Sierras, MD    Family History Family History  Problem Relation Age of Onset  . Diabetes Father   . Asthma Maternal Grandmother   . Asthma Maternal Uncle   . Pancreatic disease Neg Hx   . Colon cancer Neg Hx     Social History Social History   Tobacco Use  . Smoking status: Former Smoker    Packs/day: 0.25    Years: 15.00    Pack years: 3.75    Types: Cigarettes    Start date: 01/12/2002    Quit date: 10/31/2014    Years since quitting: 4.0  . Smokeless tobacco: Never Used  Substance Use Topics  . Alcohol use: No    Alcohol/week: 0.0 standard drinks  . Drug use: No     Allergies   Penicillins   Review of Systems Review of Systems  Constitutional: Negative for activity change, appetite change, fatigue and fever.  HENT: Negative for hearing loss and sore throat.   Respiratory: Negative.   Cardiovascular: Negative.   Gastrointestinal: Negative.   Musculoskeletal: Negative.   Skin: Negative.   Neurological: Positive for dizziness and light-headedness. Negative for tremors, syncope, speech difficulty and weakness.     Physical Exam Triage Vital Signs ED Triage Vitals  Enc  Vitals Group     BP      Pulse      Resp      Temp      Temp src      SpO2      Weight      Height      Head Circumference      Peak Flow      Pain Score      Pain Loc      Pain Edu?      Excl. in [place]?    No data found.  Updated Vital Signs BP 119/87 (BP Location: Right Arm)   Pulse 83   Temp 98.2 F (36.8 C) (Oral)   Resp 18   SpO2 98%    Physical Exam Vitals signs and nursing note reviewed.  Constitutional:      Appearance: Normal appearance.  HENT:     Head: Normocephalic.     Right Ear: Tympanic membrane normal.     Left Ear: Tympanic membrane normal.     Nose: Nose normal.     Mouth/Throat:     Pharynx: Oropharynx is clear.  Eyes:      Conjunctiva/sclera: Conjunctivae normal.  Neck:     Musculoskeletal: Normal range of motion and neck supple.  Cardiovascular:     Rate and Rhythm: Normal rate and regular rhythm.  Pulmonary:     Effort: Pulmonary effort is normal.  Musculoskeletal: Normal range of motion.  Skin:    General: Skin is warm and dry.  Neurological:     General: No focal deficit present.     Mental Status: She is alert and oriented to person, place, and time.  Psychiatric:        Mood and Affect: Mood normal.        Behavior: Behavior normal.      UC Treatments / Results  Labs (all labs ordered are listed, but only abnormal results are displayed) Labs Reviewed  GLUCOSE, CAPILLARY  CBC  CBG MONITORING, ED    EKG   Radiology No results found.  Procedures Procedures (including critical care time)  Medications Ordered in UC Medications - No data to display  Initial Impression / Assessment and Plan / UC Course  I have reviewed the triage vital signs and the nursing notes.  Pertinent labs & imaging results that were available during my care of the patient were reviewed by me and considered in my medical decision making (see chart for details).    Final Clinical Impressions(s) / UC Diagnoses   Final diagnoses:  Benign paroxysmal positional vertigo, unspecified laterality   Discharge Instructions   None    ED Prescriptions    Medication Sig Dispense Auth. Provider   meclizine (ANTIVERT) 12.5 MG tablet Take 1 tablet (12.5 mg total) by mouth 3 (three) times daily as needed for dizziness. 30 tablet Robyn Haber, MD     Controlled Substance Prescriptions [place] Controlled Substance Registry consulted? Not Applicable   Robyn Haber, MD 10/30/18 1157

## 2018-10-30 NOTE — ED Triage Notes (Signed)
Pt states she has been feeling light headed and dizzy 1 week.

## 2018-11-01 ENCOUNTER — Telehealth (HOSPITAL_COMMUNITY): Payer: Self-pay | Admitting: Emergency Medicine

## 2018-11-01 NOTE — Telephone Encounter (Signed)
Normal labs. Patient contacted and made aware of    results, all questions answered

## 2020-10-07 ENCOUNTER — Other Ambulatory Visit: Payer: Self-pay

## 2020-10-07 ENCOUNTER — Ambulatory Visit
Admission: EM | Admit: 2020-10-07 | Discharge: 2020-10-07 | Disposition: A | Discharge status: Home / Self Care | Payer: 59 | Attending: Emergency Medicine | Admitting: Emergency Medicine

## 2020-10-07 DIAGNOSIS — Z1152 Encounter for screening for COVID-19: Secondary | ICD-10-CM | POA: Diagnosis not present

## 2020-10-07 DIAGNOSIS — J029 Acute pharyngitis, unspecified: Secondary | ICD-10-CM | POA: Diagnosis not present

## 2020-10-07 DIAGNOSIS — R519 Headache, unspecified: Secondary | ICD-10-CM | POA: Insufficient documentation

## 2020-10-07 LAB — POCT RAPID STREP A (OFFICE): Rapid Strep A Screen: NEGATIVE

## 2020-10-07 MED ORDER — FLUTICASONE PROPIONATE 50 MCG/ACT NA SUSP: 1.0000 | Freq: Every day | NASAL | 0 refills | Status: DC

## 2020-10-07 MED ORDER — NAPROXEN 500 MG PO TABS: 500.0000 mg | ORAL_TABLET | Freq: Two times a day (BID) | ORAL | 0 refills | Status: DC | PRN

## 2020-10-07 MED ORDER — CETIRIZINE HCL 10 MG PO CAPS: 10.0000 mg | ORAL_CAPSULE | Freq: Every day | ORAL | 0 refills | Status: DC

## 2020-10-07 NOTE — ED Triage Notes (Signed)
Pt presents with c/o headache and sore throat for past 3 days

## 2020-10-07 NOTE — Discharge Instructions (Addendum)
Strep test negative, COVID test pending Daily cetirizine to help with throat irritation, postnasal drainage Flonase nasal spray 1 to 2 spray to help with any inflammation in sinuses contributing to pressure/headache Naprosyn twice daily for headache Drink plenty of fluids Please continue to monitor your symptoms over the next 3 to 4 days, follow-up if not improving or worsening

## 2020-10-08 NOTE — ED Provider Notes (Signed)
[facility]    CSN: [id number] Arrival date & time: 10/07/20  1553      History   Chief Complaint Chief Complaint  Patient presents with   Sore Throat   Headache    HPI Alicia Wells is a 40 y.o. female history of asthma, GERD, presenting today for evaluation of sore throat and headache.  Reports that symptoms began approximately 2 to 3 days ago.  Has had sore throat mainly on the right side.  Also reports frontal headache.  Denies associated photophobia, vision changes, nausea or vomiting.  Has not used any medicines for symptoms.  Denies fevers chills or body aches.  Denies neck stiffness.  HPI  Past Medical History:  Diagnosis Date   Allergy    Anxiety    Asthma    Blood transfusion without reported diagnosis    Depression    GERD (gastroesophageal reflux disease)    Headache(784.0)    History of esophagogastroduodenoscopy (EGD)     Patient Active Problem List   Diagnosis Date Noted   Low serum vitamin D 06/13/2015   Dyspnea 02/15/2015   GERD (gastroesophageal reflux disease) 12/03/2014   Asthma, mild intermittent 01/23/2013   Migraine headache 07/29/2012   Smoker 07/29/2012   History of depression 07/29/2012    Past Surgical History:  Procedure Laterality Date   WISDOM TOOTH EXTRACTION      OB History   No obstetric history on file.      Home Medications    Prior to Admission medications   Medication Sig Start Date End Date Taking? Authorizing Provider  Cetirizine HCl 10 MG CAPS Take 1 capsule (10 mg total) by mouth daily for 10 days. 10/07/20 10/17/20 Yes Kendrea Cerritos C, PA-C  fluticasone (FLONASE) 50 MCG/ACT nasal spray Place 1-2 sprays into both nostrils daily. 10/07/20  Yes Jaykob Minichiello C, PA-C  naproxen (NAPROSYN) 500 MG tablet Take 1 tablet (500 mg total) by mouth 2 (two) times daily as needed for headache. 10/07/20  Yes Avir Deruiter C, PA-C  albuterol (PROAIR HFA) 108 (90 Base) MCG/ACT inhaler inhale 2 puffs every 6 hours  if needed for wheezing shortness of breath 06/14/17   Burchette, Alinda Sierras, MD  escitalopram (LEXAPRO) 5 MG tablet Take 1 tablet (5 mg total) by mouth at bedtime. 06/14/17   Burchette, Alinda Sierras, MD  meclizine (ANTIVERT) 12.5 MG tablet Take 1 tablet (12.5 mg total) by mouth 3 (three) times daily as needed for dizziness. 10/30/18   Robyn Haber, MD  Multiple Vitamin (MULTIVITAMIN WITH MINERALS) TABS tablet Take 1 tablet by mouth daily.    [provider]  omeprazole (PRILOSEC) 20 MG capsule TAKE 1 CAPSULE BY MOUTH EVERY DAY 06/13/18   Burchette, Alinda Sierras, MD    Family History Family History  Problem Relation Age of Onset   Diabetes Father    Asthma Maternal Grandmother    Asthma Maternal Uncle    Pancreatic disease Neg Hx    Colon cancer Neg Hx     Social History Social History   Tobacco Use   Smoking status: Former    Packs/day: 0.25    Years: 15.00    Pack years: 3.75    Types: Cigarettes    Start date: 01/12/2002    Quit date: 10/31/2014    Years since quitting: 5.9   Smokeless tobacco: Never  Substance Use Topics   Alcohol use: No    Alcohol/week: 0.0 standard drinks   Drug use: No     Allergies  Penicillins   Review of Systems Review of Systems  Constitutional:  Negative for activity change, appetite change, chills, fatigue and fever.  HENT:  Positive for sore throat. Negative for congestion, ear pain, rhinorrhea, sinus pressure and trouble swallowing.   Eyes:  Negative for discharge and redness.  Respiratory:  Negative for cough, chest tightness and shortness of breath.   Cardiovascular:  Negative for chest pain.  Gastrointestinal:  Negative for abdominal pain, diarrhea, nausea and vomiting.  Musculoskeletal:  Negative for myalgias.  Skin:  Negative for rash.  Neurological:  Positive for headaches. Negative for dizziness and light-headedness.    Physical Exam Triage Vital Signs ED Triage Vitals  Enc Vitals Group     BP 10/07/20 1603 101/70     Pulse  Rate 10/07/20 1603 70     Resp 10/07/20 1603 20     Temp 10/07/20 1603 98.1 F (36.7 C)     Temp src --      SpO2 10/07/20 1603 99 %     Weight --      Height --      Head Circumference --      Peak Flow --      Pain Score 10/07/20 1601 10     Pain Loc --      Pain Edu? --      Excl. in [place]? --    No data found.  Updated Vital Signs BP 101/70   Pulse 70   Temp 98.1 F (36.7 C)   Resp 20   SpO2 99%   Visual Acuity Right Eye Distance:   Left Eye Distance:   Bilateral Distance:    Right Eye Near:   Left Eye Near:    Bilateral Near:     Physical Exam Vitals and nursing note reviewed.  Constitutional:      Appearance: She is well-developed.     Comments: No acute distress  HENT:     Head: Normocephalic and atraumatic.     Ears:     Comments: Bilateral ears without tenderness to palpation of external auricle, tragus and mastoid, EAC's without erythema or swelling, TM's with good bony landmarks and cone of light. Non erythematous.      Nose: Nose normal.     Mouth/Throat:     Comments: Right tonsil mildly enlarged compared to left, minimal erythema, no exudate, no soft palate swelling, uvula midline, posterior pharynx patent Eyes:     Conjunctiva/sclera: Conjunctivae normal.  Cardiovascular:     Rate and Rhythm: Normal rate and regular rhythm.  Pulmonary:     Effort: Pulmonary effort is normal. No respiratory distress.     Comments: Breathing comfortably at rest, CTABL, no wheezing, rales or other adventitious sounds auscultated  Abdominal:     General: There is no distension.  Musculoskeletal:        General: Normal range of motion.     Cervical back: Neck supple.  Skin:    General: Skin is warm and dry.  Neurological:     Mental Status: She is alert and oriented to person, place, and time.     UC Treatments / Results  Labs (all labs ordered are listed, but only abnormal results are displayed) Labs Reviewed  CULTURE, GROUP A STREP ([place])  NOVEL  CORONAVIRUS, NAA  POCT RAPID STREP A (OFFICE)    EKG   Radiology No results found.  Procedures Procedures (including critical care time)  Medications Ordered in UC Medications - No data to display  Initial  Impression / Assessment and Plan / UC Course  I have reviewed the triage vital signs and the nursing notes.  Pertinent labs & imaging results that were available during my care of the patient were reviewed by me and considered in my medical decision making (see chart for details).     Suspect likely viral URI contributing to sore throat and headache secondary to sinus pressure, treating headache with Naprosyn, Flonase and Zyrtec to help with any sinus pressure, congestion and postnasal drainage contributing to throat irritation, strep negative, COVID pending, continue symptomatic and supportive care with close monitoring, no neurodeficits or red flags for headache.  Discussed strict return precautions. Patient verbalized understanding and is agreeable with plan.  Final Clinical Impressions(s) / UC Diagnoses   Final diagnoses:  Encounter for screening for COVID-19  Sore throat  Acute nonintractable headache, unspecified headache type     Discharge Instructions      Strep test negative, COVID test pending Daily cetirizine to help with throat irritation, postnasal drainage Flonase nasal spray 1 to 2 spray to help with any inflammation in sinuses contributing to pressure/headache Naprosyn twice daily for headache Drink plenty of fluids Please continue to monitor your symptoms over the next 3 to 4 days, follow-up if not improving or worsening     ED Prescriptions     Medication Sig Dispense Auth. Provider   naproxen (NAPROSYN) 500 MG tablet Take 1 tablet (500 mg total) by mouth 2 (two) times daily as needed for headache. 30 tablet Wealthy Danielski C, PA-C   fluticasone (FLONASE) 50 MCG/ACT nasal spray Place 1-2 sprays into both nostrils daily. 16 g Mathis Cashman C,  PA-C   Cetirizine HCl 10 MG CAPS Take 1 capsule (10 mg total) by mouth daily for 10 days. 10 capsule Dossie Swor, [name] C, PA-C      PDMP not reviewed this encounter.   Janith Lima, [place] 10/08/20 [phone]

## 2020-10-09 LAB — SARS-COV-2, NAA 2 DAY TAT

## 2020-10-09 LAB — NOVEL CORONAVIRUS, NAA: SARS-CoV-2, NAA: NOT DETECTED

## 2020-10-11 LAB — CULTURE, GROUP A STREP (THRC)

## 2020-11-19 ENCOUNTER — Other Ambulatory Visit: Payer: Self-pay | Admitting: Family Medicine

## 2020-11-19 DIAGNOSIS — Z1231 Encounter for screening mammogram for malignant neoplasm of breast: Secondary | ICD-10-CM

## 2020-12-31 ENCOUNTER — Ambulatory Visit
Admission: EM | Admit: 2020-12-31 | Discharge: 2020-12-31 | Disposition: A | Discharge status: Home / Self Care | Payer: 59 | Attending: Emergency Medicine | Admitting: Emergency Medicine

## 2020-12-31 ENCOUNTER — Other Ambulatory Visit: Payer: Self-pay

## 2020-12-31 DIAGNOSIS — M79604 Pain in right leg: Secondary | ICD-10-CM

## 2020-12-31 DIAGNOSIS — Z8639 Personal history of other endocrine, nutritional and metabolic disease: Secondary | ICD-10-CM

## 2020-12-31 DIAGNOSIS — R200 Anesthesia of skin: Secondary | ICD-10-CM

## 2020-12-31 DIAGNOSIS — N920 Excessive and frequent menstruation with regular cycle: Secondary | ICD-10-CM

## 2020-12-31 DIAGNOSIS — Z862 Personal history of diseases of the blood and blood-forming organs and certain disorders involving the immune mechanism: Secondary | ICD-10-CM

## 2020-12-31 DIAGNOSIS — G2581 Restless legs syndrome: Secondary | ICD-10-CM

## 2020-12-31 DIAGNOSIS — K2100 Gastro-esophageal reflux disease with esophagitis, without bleeding: Secondary | ICD-10-CM

## 2020-12-31 DIAGNOSIS — R5383 Other fatigue: Secondary | ICD-10-CM

## 2020-12-31 MED ORDER — IBUPROFEN 800 MG PO TABS: 800.0000 mg | ORAL_TABLET | Freq: Three times a day (TID) | ORAL | 0 refills | Status: DC

## 2020-12-31 MED ORDER — OMEPRAZOLE 20 MG PO CPDR: DELAYED_RELEASE_CAPSULE | ORAL | 0 refills | Status: AC

## 2020-12-31 NOTE — ED Provider Notes (Addendum)
[facility]    CSN: [id number] Arrival date & time: 12/31/20  1318      History   Chief Complaint Chief Complaint  Patient presents with   Leg Pain    HPI Alicia Wells is a 40 y.o. female.   Established  Patient complains of burning sensation in her right upper leg that has been going on for the past 3 days.  Patient states that when she stands for long periods of time she also has some numbness in that same area.  Patient states she is not having any difficulty walking at this time.  Patient states she is not been sleeping well lately, states her legs have been very restless and it is difficult to settle them down so she can fall asleep.  Additionally, she also states she has felt very fatigued lately, more so than just when she is having trouble sleeping.  States he does not have a lot of energy and often does not feel like engaging in social activities outside of work.  The history is provided by the patient.   Past Medical History:  Diagnosis Date   Allergy    Anxiety    Asthma    Blood transfusion without reported diagnosis    Depression    GERD (gastroesophageal reflux disease)    Headache(784.0)    History of esophagogastroduodenoscopy (EGD)     Patient Active Problem List   Diagnosis Date Noted   Low serum vitamin D 06/13/2015   Dyspnea 02/15/2015   GERD (gastroesophageal reflux disease) 12/03/2014   Asthma, mild intermittent 01/23/2013   Migraine headache 07/29/2012   Smoker 07/29/2012   History of depression 07/29/2012    Past Surgical History:  Procedure Laterality Date   WISDOM TOOTH EXTRACTION      OB History   No obstetric history on file.      Home Medications    Prior to Admission medications   Medication Sig Start Date End Date Taking? Authorizing Provider  ibuprofen (ADVIL) 800 MG tablet Take 1 tablet (800 mg total) by mouth 3 (three) times daily. 12/31/20  Yes Lynden Oxford Scales, PA-C  albuterol [facility] HFA) 108  (90 Base) MCG/ACT inhaler inhale 2 puffs every 6 hours if needed for wheezing shortness of breath 06/14/17   Burchette, Alinda Sierras, MD  Cetirizine HCl 10 MG CAPS Take 1 capsule (10 mg total) by mouth daily for 10 days. 10/07/20 10/17/20  Wieters, Hallie C, PA-C  Multiple Vitamin (MULTIVITAMIN WITH MINERALS) TABS tablet Take 1 tablet by mouth daily.    [provider]  naproxen (NAPROSYN) 500 MG tablet Take 1 tablet (500 mg total) by mouth 2 (two) times daily as needed for headache. 10/07/20   Wieters, Madelynn Done C, PA-C  omeprazole (PRILOSEC) 20 MG capsule TAKE 1 CAPSULE BY MOUTH EVERY DAY 12/31/20   Lynden Oxford Scales, PA-C    Family History Family History  Problem Relation Age of Onset   Diabetes Father    Asthma Maternal Grandmother    Asthma Maternal Uncle    Pancreatic disease Neg Hx    Colon cancer Neg Hx     Social History Social History   Tobacco Use   Smoking status: Former    Packs/day: 0.25    Years: 15.00    Pack years: 3.75    Types: Cigarettes    Start date: 01/12/2002    Quit date: 10/31/2014    Years since quitting: 6.1   Smokeless tobacco: Never  Substance Use Topics  Alcohol use: No    Alcohol/week: 0.0 standard drinks   Drug use: No     Allergies   Penicillins   Review of Systems Review of Systems Pertinent findings noted in history of present illness.    Physical Exam Triage Vital Signs ED Triage Vitals  Enc Vitals Group     BP 12/31/20 1407 122/81     Pulse Rate 12/31/20 1407 78     Resp 12/31/20 1407 18     Temp 12/31/20 1407 98.2 F (36.8 C)     Temp Source 12/31/20 1407 Oral     SpO2 12/31/20 1407 98 %     Weight 12/31/20 1406 159 lb (72.1 kg)     Height --      Head Circumference --      Peak Flow --      Pain Score 12/31/20 1404 10     Pain Loc --      Pain Edu? --      Excl. in [place]? --    No data found.  Updated Vital Signs BP 122/81 (BP Location: Right Arm)   Pulse 78   Temp 98.2 F (36.8 C) (Oral)   Resp 18   Wt  159 lb (72.1 kg)   LMP 12/31/2020   SpO2 98%   BMI 24.66 kg/m   Visual Acuity Right Eye Distance:   Left Eye Distance:   Bilateral Distance:    Right Eye Near:   Left Eye Near:    Bilateral Near:     Physical Exam Vitals and nursing note reviewed.  Constitutional:      Appearance: Normal appearance.  HENT:     Head: Normocephalic and atraumatic.  Eyes:     Conjunctiva/sclera: Conjunctivae normal.  Cardiovascular:     Rate and Rhythm: Normal rate and regular rhythm.     Pulses: Normal pulses.     Heart sounds: Normal heart sounds.  Pulmonary:     Effort: Pulmonary effort is normal.     Breath sounds: Normal breath sounds.  Musculoskeletal:        General: Normal range of motion.  Skin:    General: Skin is warm and dry.  Neurological:     General: No focal deficit present.     Mental Status: She is alert and oriented to person, place, and time. Mental status is at baseline.  Psychiatric:        Mood and Affect: Mood normal.        Behavior: Behavior normal.     UC Treatments / Results  Labs (all labs ordered are listed, but only abnormal results are displayed) Labs Reviewed - No data to display  EKG   Radiology No results found.  Procedures Procedures (including critical care time)  Medications Ordered in UC Medications - No data to display  Initial Impression / Assessment and Plan / UC Course  I have reviewed the triage vital signs and the nursing notes.  Pertinent labs & imaging results that were available during my care of the patient were reviewed by me and considered in my medical decision making (see chart for details).     Patient I discussed her history of iron deficiency, patient states she is currently taking a multivitamin that contains iron however has not had her iron levels checked since 2017, at that time her iron level was 18 UG/DL.  Patient advised that with her reported symptoms of fatigue, numbness, restless legs at night difficulty  sleeping I strongly recommend  that she make an appointment to follow-up with her primary care to have her iron levels checked and treated if needed.  I have also prescribed patient some ibuprofen at her request for her perceived pain.  Patient also requested renewal of omeprazole 20 mg which she takes as needed for heartburn, I also advised patient she should follow-up with her primary care provider to discuss necessity and/or continuation of this medication, I am happy to provide her with 30 days until she is able to get an appointment.  Patient verbalized understanding and agreement of plan as discussed.  All questions were addressed during visit.  Please see discharge instructions below for further details of plan.  Final Clinical Impressions(s) / UC Diagnoses   Final diagnoses:  Pain of right lower extremity  History of iron deficiency  Menorrhagia with regular cycle  Gastroesophageal reflux disease with esophagitis without hemorrhage  History of anemia  Other fatigue  Restless legs  Numbness and tingling of leg     Discharge Instructions      Thank you for visiting urgent care today.  Based on your symptoms of fatigue, not sleeping well, numbness, pain, tingling in your right leg and having restless legs when trying to fall asleep at night, I believe that your iron may again be low.  Even though you are taking a multivitamin that contains iron, this can still be an issue because not everyone absorbs iron at the same rate or quantity.  Strongly recommend that you make an appointment to meet with your primary care provider to have your iron and blood counts checked to make sure you are not anemic or iron deficient.  Please also be sure to discuss your continued treatment of gastroesophageal reflux disease and whether or not you may need to be referred to gastroenterology for an endoscopic evaluation to look for signs of gastritis or precancerous lesions called Barrett's esophagus.  I am happy  to provide you with a prescription for ibuprofen for your pain today as well as to renew your prescription for omeprazole until you are able to see your provider.     ED Prescriptions     Medication Sig Dispense Auth. Provider   ibuprofen (ADVIL) 800 MG tablet Take 1 tablet (800 mg total) by mouth 3 (three) times daily. 21 tablet Lynden Oxford Scales, PA-C   omeprazole (PRILOSEC) 20 MG capsule TAKE 1 CAPSULE BY MOUTH EVERY DAY 30 capsule Lynden Oxford Scales, PA-C      PDMP not reviewed this encounter.   Lynden Oxford Scales, PA-C 12/31/20 1506    Lynden Oxford Scales, [place] 12/31/20 1513

## 2020-12-31 NOTE — Discharge Instructions (Addendum)
Thank you for visiting urgent care today.  Based on your symptoms of fatigue, not sleeping well, numbness, pain, tingling in your right leg and having restless legs when trying to fall asleep at night, I believe that your iron may again be low.  Even though you are taking a multivitamin that contains iron, this can still be an issue because not everyone absorbs iron at the same rate or quantity.  Strongly recommend that you make an appointment to meet with your primary care provider to have your iron and blood counts checked to make sure you are not anemic or iron deficient.  Please also be sure to discuss your continued treatment of gastroesophageal reflux disease and whether or not you may need to be referred to gastroenterology for an endoscopic evaluation to look for signs of gastritis or precancerous lesions called Barrett's esophagus.  I am happy to provide you with a prescription for ibuprofen for your pain today as well as to renew your prescription for omeprazole until you are able to see your provider.

## 2020-12-31 NOTE — ED Triage Notes (Addendum)
Pt reports having burning sensation right rt upper leg that has been going on for three days. She reports numbness when standing on extremity for extended periods of time.   Patient is ambulatory.  Patient requesting refill on omeprazole.

## 2022-03-23 HISTORY — PX: VAGINAL HYSTERECTOMY: SUR661

## 2022-04-17 ENCOUNTER — Encounter (HOSPITAL_BASED_OUTPATIENT_CLINIC_OR_DEPARTMENT_OTHER): Payer: Self-pay | Admitting: Obstetrics and Gynecology

## 2022-04-17 ENCOUNTER — Other Ambulatory Visit: Payer: Self-pay

## 2022-04-17 NOTE — Progress Notes (Addendum)
Spoke w/ via phone for pre-op interview---Alicia Wells needs dos----urine pregnancy per anethesia, surgeon orders pending as of 04/17/22               Wells results------04/22/22 Wells appt for cbc, type & screen COVID test -----patient states asymptomatic no test needed Arrive at -------0530 on Monday, 04/27/22 NPO after MN NO Solid Food.  Clear liquids from MN until---0430 Med rec completed Medications to take morning of surgery -----Albuterol inhaler prn, Prilosec prn, LoEstrin Diabetic medication -----n/a Patient instructed no nail polish to be worn day of surgery Patient instructed to bring photo id and insurance card day of surgery Patient aware to have Driver (ride ) / caregiver    for 24 hours after surgery - mom, Alicia Wells Patient Special Instructions -----Extended / overnight stay instructions given. Pre-Op special Istructions -----Requested orders from Dr. Sandford Craze via Epic IB on 04/17/22. Patient verbalized understanding of instructions that were given at this phone interview. Patient denies shortness of breath, chest pain, fever, cough at this phone interview.

## 2022-04-17 NOTE — Progress Notes (Signed)
Your procedure is scheduled on Monday, 04/27/2022.  Report to Alicia Wells.   Call this number if you have problems the morning of surgery  :[phone].   OUR ADDRESS IS [address].  WE ARE LOCATED IN [facility].  PLEASE BRING YOUR INSURANCE CARD AND PHOTO ID DAY OF SURGERY.  ONLY 2 PEOPLE ARE ALLOWED IN  WAITING  ROOM.                                      REMEMBER:  DO NOT EAT FOOD, CANDY GUM OR MINTS  AFTER MIDNIGHT THE NIGHT BEFORE YOUR SURGERY . YOU MAY HAVE CLEAR LIQUIDS FROM MIDNIGHT THE NIGHT BEFORE YOUR SURGERY UNTIL  4:30 AM. NO CLEAR LIQUIDS AFTER   4:30 AM DAY OF SURGERY.  YOU MAY  BRUSH YOUR TEETH MORNING OF SURGERY AND RINSE YOUR MOUTH OUT, NO CHEWING GUM CANDY OR MINTS.     CLEAR LIQUID DIET   Foods Allowed                                                                     Foods Excluded  Coffee and tea, regular and decaf                             liquids that you cannot  Plain Jell-O                                                                   see through such as: Fruit ices (not with fruit pulp)                                     milk, soups, orange juice  Plain  Popsicles                                    All solid food Carbonated beverages, regular and diet                                    Cranberry, grape and apple juices Sports drinks like Gatorade _____________________________________________________________________     TAKE ONLY THESE MEDICATIONS MORNING OF SURGERY: Albuterol inhaler if needed, Prilosec if needed, Loestrin birth control pill    UP TO 4 VISITORS  MAY VISIT IN THE EXTENDED RECOVERY ROOM UNTIL 800 PM ONLY.  ONE  VISITOR AGE 44 AND OVER MAY SPEND THE NIGHT AND MUST BE IN EXTENDED RECOVERY ROOM NO LATER THAN 800 PM . YOUR DISCHARGE TIME AFTER YOU SPEND THE NIGHT IS 900 AM THE MORNING AFTER YOUR SURGERY.  YOU MAY PACK  A SMALL OVERNIGHT BAG WITH TOILETRIES FOR YOUR  OVERNIGHT STAY IF YOU WISH.  YOUR PRESCRIPTION MEDICATIONS WILL BE PROVIDED DURING Alicia Wells.                                      DO NOT WEAR JEWERLY, MAKE UP. DO NOT WEAR LOTIONS, POWDERS, PERFUMES OR NAIL POLISH ON YOUR FINGERNAILS. TOENAIL POLISH IS OK TO WEAR. DO NOT SHAVE FOR 48 HOURS PRIOR TO DAY OF SURGERY. MEN MAY SHAVE FACE AND NECK. CONTACTS, GLASSES, OR DENTURES MAY NOT BE WORN TO SURGERY.  REMEMBER: NO SMOKING, DRUGS OR ALCOHOL FOR 24 HOURS BEFORE YOUR SURGERY.                                    Alicia Wells IS NOT RESPONSIBLE  FOR ANY BELONGINGS.                                                                    Alicia Wells Kitchen           Alicia Wells - Preparing for Surgery Before surgery, you can play an important role.  Because skin is not sterile, your skin needs to be as free of germs as possible.  You can reduce the number of germs on your skin by washing with CHG (chlorahexidine gluconate) soap before surgery.  CHG is an antiseptic cleaner which kills germs and bonds with the skin to continue killing germs even after washing. Please DO NOT use if you have an allergy to CHG or antibacterial soaps.  If your skin becomes reddened/irritated stop using the CHG and inform your nurse when you arrive at Short Stay. Do not shave (including legs and underarms) for at least 48 hours prior to the first CHG shower.  You may shave your face/neck. Please follow these instructions carefully:  1.  Shower with CHG Soap the night before surgery and the  morning of Surgery.  2.  If you choose to wash your hair, wash your hair first as usual with your  normal  shampoo.  3.  After you shampoo, rinse your hair and body thoroughly to remove the  shampoo.                                        4.  Use CHG as you would any other liquid soap.  You can apply chg directly  to the skin and wash , chg soap provided, night before and morning of your surgery.  5.  Apply the CHG Soap to your body ONLY FROM THE  NECK DOWN.   Do not use on face/ open                           Wound or open sores. Avoid contact with eyes, ears mouth and genitals (private parts).                       Wash face,  Genitals (private parts) with your normal soap.             6.  Wash thoroughly, paying special attention to the area where your surgery  will be performed.  7.  Thoroughly rinse your body with warm water from the neck down.  8.  DO NOT shower/wash with your normal soap after using and rinsing off  the CHG Soap.             9.  Pat yourself dry with a clean towel.            10.  Wear clean pajamas.            11.  Place clean sheets on your bed the night of your first shower and do not  sleep with pets. Day of Surgery : Do not apply any lotions/deodorants the morning of surgery.  Please wear clean clothes to the hospital/surgery center.  IF YOU HAVE ANY SKIN IRRITATION OR PROBLEMS WITH THE SURGICAL SOAP, PLEASE GET A BAR OF GOLD DIAL SOAP AND SHOWER THE NIGHT BEFORE YOUR SURGERY AND THE MORNING OF YOUR SURGERY. PLEASE LET THE NURSE KNOW MORNING OF YOUR SURGERY IF YOU HAD ANY PROBLEMS WITH THE SURGICAL SOAP.   ________________________________________________________________________                                                        QUESTIONS Alicia Wells PRE OP NURSE PHONE [phone].

## 2022-04-22 ENCOUNTER — Encounter (HOSPITAL_COMMUNITY)
Admission: RE | Admit: 2022-04-22 | Discharge: 2022-04-22 | Disposition: A | Discharge status: Home / Self Care | Payer: 59 | Source: Ambulatory Visit | Attending: Obstetrics and Gynecology | Admitting: Obstetrics and Gynecology

## 2022-04-22 DIAGNOSIS — Z01818 Encounter for other preprocedural examination: Secondary | ICD-10-CM

## 2022-04-22 DIAGNOSIS — Z01812 Encounter for preprocedural laboratory examination: Secondary | ICD-10-CM | POA: Insufficient documentation

## 2022-04-22 LAB — COMPREHENSIVE METABOLIC PANEL
ALT: 13 U/L (ref 0–44)
AST: 21 U/L (ref 15–41)
Albumin: 3.7 g/dL (ref 3.5–5.0)
Alkaline Phosphatase: 77 U/L (ref 38–126)
Anion gap: 10 (ref 5–15)
BUN: 15 mg/dL (ref 6–20)
CO2: 25 mmol/L (ref 22–32)
Calcium: 8.7 mg/dL — ABNORMAL LOW (ref 8.9–10.3)
Chloride: 102 mmol/L (ref 98–111)
Creatinine, Ser: 1.04 mg/dL — ABNORMAL HIGH (ref 0.44–1.00)
GFR, Estimated: >60 mL/min (ref >60)
Glucose, Bld: 95 mg/dL (ref 70–99)
Potassium: 4.4 mmol/L (ref 3.5–5.1)
Sodium: 137 mmol/L (ref 135–145)
Total Bilirubin: 0.7 mg/dL (ref 0.3–1.2)
Total Protein: 7.8 g/dL (ref 6.5–8.1)

## 2022-04-22 LAB — CBC
HCT: 42.4 % (ref 36.0–46.0)
Hemoglobin: 13.8 g/dL (ref 12.0–15.0)
MCH: 28.9 pg (ref 26.0–34.0)
MCHC: 32.5 g/dL (ref 30.0–36.0)
MCV: 88.7 fL (ref 80.0–100.0)
Platelets: 297 10*3/uL (ref 150–400)
RBC: 4.78 MIL/uL (ref 3.87–5.11)
RDW: 14.8 % (ref 11.5–15.5)
WBC: 14.4 10*3/uL — ABNORMAL HIGH (ref 4.0–10.5)
nRBC: 0 % (ref 0.0–0.2)

## 2022-04-22 NOTE — Progress Notes (Signed)
I routed 04/22/22 lab work to Dr. Sandford Craze. WBC was 14.4.

## 2022-04-22 NOTE — H&P (Signed)
Alicia Wells is an 42 y.o. female G75 with large fibroid uterus for Robot assisted total laparoscopic hysterectomy, B salpingectomy, possible cystoscopy.  D/W pt r/b/a of surgery, also process and expectations.  US shows enlarged uterus with subserossal, submucosal and intramural fibroids - multiple 2-3 cm, Also 7x8x9cm, ovaries not visualized.  Medical history of asthma.    Pertinent Gynecological History: G0P0 No abnormal pap, last 10/22 No STI  Menstrual History: Patient's last menstrual period was 04/08/2022 (exact date).    Past Medical History:  Diagnosis Date   Anemia    from heavy menstrual bleeding / pt is taking daily iron supplements   Anxiety    resolved per pt   Asthma    mild intermittent, pt states she quit smoking and no longer needs inhaler   Blood transfusion without reported diagnosis    Depression    resolved per pt   GERD (gastroesophageal reflux disease)    takes Prilosec as needed   History of esophagogastroduodenoscopy (EGD) 02/01/2015   Uterine leiomyoma     Past Surgical History:  Procedure Laterality Date   WISDOM TOOTH EXTRACTION      Family History  Problem Relation Age of Onset   Diabetes Father    Asthma Maternal Grandmother    Asthma Maternal Uncle    Pancreatic disease Neg Hx    Colon cancer Neg Hx     Social History:  reports that she quit smoking about 5 years ago. Her smoking use included cigarettes. She started smoking about 20 years ago. She has a 3.75 pack-year smoking history. She has never used smokeless tobacco. She reports that she does not drink alcohol and does not use drugs. Single [employer]  Allergies:  Allergies  Allergen Reactions   Penicillins Anaphylaxis    Rash, hives Has patient had a PCN reaction causing immediate rash, facial/tongue/throat swelling, SOB or lightheadedness with hypotension:  yes Has patient had a PCN reaction causing severe rash involving mucus membranes or skin necrosis:Unknown Has patient had a  PCN reaction that required hospitalization: no Has patient had a PCN reaction occurring within the last 10 years: no If all of the above answers are "NO", then may proceed with Cephalosporin use.    Ethanol-Alcohol [Ethanol] Hives and Swelling    If patient drinks alcohol she has swelling in her lips and bumps on her arms.    Meds: Vit D, Iron, fluticasone, ibuprofen/naproxen, prilosec, ventolin, recent prednisolone taper  Review of Systems  Constitutional: Negative.   HENT: Negative.    Respiratory: Negative.    Cardiovascular: Negative.   Gastrointestinal: Negative.   Genitourinary:  Positive for menstrual problem and pelvic pain.  Musculoskeletal: Negative.   Skin: Negative.   Neurological: Negative.   Psychiatric/Behavioral: Negative.      Height '5\' 6"'$  (1.676 m), weight 78 kg, last menstrual period 04/08/2022. Physical Exam Constitutional:      Appearance: Normal appearance.  HENT:     Head: Normocephalic and atraumatic.  Cardiovascular:     Rate and Rhythm: Normal rate and regular rhythm.  Pulmonary:     Effort: Pulmonary effort is normal.     Breath sounds: Normal breath sounds.  Abdominal:     General: Bowel sounds are normal.     Palpations: Abdomen is soft.  Genitourinary:    General: Normal vulva.     Rectum: Normal.  Musculoskeletal:        General: Normal range of motion.     Cervical back: Normal range of motion and  neck supple.  Skin:    General: Skin is warm and dry.  Neurological:     General: No focal deficit present.     Mental Status: She is alert and oriented to person, place, and time.  Psychiatric:        Mood and Affect: Mood normal.        Behavior: Behavior normal.     Results for orders placed or performed during the hospital encounter of 04/22/22 (from the past 24 hour(s))  CBC     Status: Abnormal   Collection Time: 04/22/22  1:32 PM  Result Value Ref Range   WBC 14.4 (H) 4.0 - 10.5 K/uL   RBC 4.78 3.87 - 5.11 MIL/uL   Hemoglobin  13.8 12.0 - 15.0 g/dL   HCT 42.4 36.0 - 46.0 %   MCV 88.7 80.0 - 100.0 fL   MCH 28.9 26.0 - 34.0 pg   MCHC 32.5 30.0 - 36.0 g/dL   RDW 14.8 11.5 - 15.5 %   Platelets 297 150 - 400 K/uL   nRBC 0.0 0.0 - 0.2 %  Type and screen [facility]     Status: None   Collection Time: 04/22/22  1:32 PM  Result Value Ref Range   ABO/RH(D) O POS    Antibody Screen NEG    Sample Expiration 05/06/2022,2359    Extend sample reason      NO TRANSFUSIONS OR PREGNANCY IN THE PAST 3 MONTHS Performed at [facility], [place] [address], [place], [place] [address]   Comprehensive metabolic panel     Status: Abnormal   Collection Time: 04/22/22  1:32 PM  Result Value Ref Range   Sodium 137 135 - 145 mmol/L   Potassium 4.4 3.5 - 5.1 mmol/L   Chloride 102 98 - 111 mmol/L   CO2 25 22 - 32 mmol/L   Glucose, Bld 95 70 - 99 mg/dL   BUN 15 6 - 20 mg/dL   Creatinine, Ser 1.04 (H) 0.44 - 1.00 mg/dL   Calcium 8.7 (L) 8.9 - 10.3 mg/dL   Total Protein 7.8 6.5 - 8.1 g/dL   Albumin 3.7 3.5 - 5.0 g/dL   AST 21 15 - 41 U/L   ALT 13 0 - 44 U/L   Alkaline Phosphatase 77 38 - 126 U/L   Total Bilirubin 0.7 0.3 - 1.2 mg/dL   GFR, Estimated >60 >60 mL/min   Anion gap 10 5 - 15    [place]: enlarged uterus with subserossal, submucosal and intramural fibroids - multiple 2-3 cm, Also 7x8x9cm, ovaries not visualized.   Assessment/Plan: 42yo G0 with large fibroid uterus for RA TLH/BS/ poss cystoscopy D/w pt r/b/a of surgery, also process and expectations Wi;; proceed  Danetta Prom Bovard-Stuckert 04/22/2022, 8:56 PM

## 2022-04-27 ENCOUNTER — Other Ambulatory Visit: Payer: Self-pay

## 2022-04-27 ENCOUNTER — Encounter (HOSPITAL_BASED_OUTPATIENT_CLINIC_OR_DEPARTMENT_OTHER): Payer: Self-pay | Admitting: Obstetrics and Gynecology

## 2022-04-27 ENCOUNTER — Ambulatory Visit (HOSPITAL_BASED_OUTPATIENT_CLINIC_OR_DEPARTMENT_OTHER): Payer: 59 | Admitting: Anesthesiology

## 2022-04-27 ENCOUNTER — Encounter (HOSPITAL_BASED_OUTPATIENT_CLINIC_OR_DEPARTMENT_OTHER)
Admission: RE | Disposition: A | Discharge status: Home / Self Care | Payer: Self-pay | Source: Ambulatory Visit | Attending: Obstetrics and Gynecology

## 2022-04-27 ENCOUNTER — Observation Stay (HOSPITAL_BASED_OUTPATIENT_CLINIC_OR_DEPARTMENT_OTHER)
Admission: RE | Admit: 2022-04-27 | Discharge: 2022-04-27 | Disposition: A | Discharge status: Home / Self Care | Payer: 59 | Source: Ambulatory Visit | Attending: Obstetrics and Gynecology | Admitting: Obstetrics and Gynecology

## 2022-04-27 DIAGNOSIS — Z9889 Other specified postprocedural states: Secondary | ICD-10-CM

## 2022-04-27 DIAGNOSIS — J45909 Unspecified asthma, uncomplicated: Secondary | ICD-10-CM | POA: Insufficient documentation

## 2022-04-27 DIAGNOSIS — Z87891 Personal history of nicotine dependence: Secondary | ICD-10-CM | POA: Diagnosis not present

## 2022-04-27 DIAGNOSIS — Z01818 Encounter for other preprocedural examination: Secondary | ICD-10-CM

## 2022-04-27 DIAGNOSIS — D259 Leiomyoma of uterus, unspecified: Principal | ICD-10-CM | POA: Insufficient documentation

## 2022-04-27 DIAGNOSIS — Z79899 Other long term (current) drug therapy: Secondary | ICD-10-CM | POA: Insufficient documentation

## 2022-04-27 HISTORY — DX: Anemia, unspecified: D64.9

## 2022-04-27 HISTORY — PX: ROBOTIC ASSISTED LAPAROSCOPIC HYSTERECTOMY AND SALPINGECTOMY: SHX6379

## 2022-04-27 HISTORY — DX: Leiomyoma of uterus, unspecified: D25.9

## 2022-04-27 HISTORY — PX: CYSTOSCOPY: SHX5120

## 2022-04-27 LAB — BASIC METABOLIC PANEL
Anion gap: 12 (ref 5–15)
BUN: 17 mg/dL (ref 6–20)
CO2: 22 mmol/L (ref 22–32)
Calcium: 8.8 mg/dL — ABNORMAL LOW (ref 8.9–10.3)
Chloride: 103 mmol/L (ref 98–111)
Creatinine, Ser: 0.97 mg/dL (ref 0.44–1.00)
GFR, Estimated: >60 mL/min (ref >60)
Glucose, Bld: 161 mg/dL — ABNORMAL HIGH (ref 70–99)
Potassium: 4.3 mmol/L (ref 3.5–5.1)
Sodium: 137 mmol/L (ref 135–145)

## 2022-04-27 LAB — CBC
HCT: 39.4 % (ref 36.0–46.0)
Hemoglobin: 12.9 g/dL (ref 12.0–15.0)
MCH: 29.1 pg (ref 26.0–34.0)
MCHC: 32.7 g/dL (ref 30.0–36.0)
MCV: 88.9 fL (ref 80.0–100.0)
Platelets: 323 10*3/uL (ref 150–400)
RBC: 4.43 MIL/uL (ref 3.87–5.11)
RDW: 14.5 % (ref 11.5–15.5)
WBC: 18.5 10*3/uL — ABNORMAL HIGH (ref 4.0–10.5)
nRBC: 0 % (ref 0.0–0.2)

## 2022-04-27 LAB — TYPE AND SCREEN
ABO/RH(D): O POS
Antibody Screen: NEGATIVE

## 2022-04-27 LAB — ABO/RH: ABO/RH(D): O POS

## 2022-04-27 LAB — POCT PREGNANCY, URINE: Preg Test, Ur: NEGATIVE

## 2022-04-27 SURGERY — XI ROBOTIC ASSISTED LAPAROSCOPIC HYSTERECTOMY AND SALPINGECTOMY
Anesthesia: General | Site: Pelvis

## 2022-04-27 MED ORDER — OXYCODONE HCL 5 MG PO TABS: 5.0000 mg | ORAL_TABLET | Freq: Once | ORAL | Status: DC | PRN

## 2022-04-27 MED ORDER — ALUM & MAG HYDROXIDE-SIMETH 200-200-20 MG/5ML PO SUSP
30.0000 mL | ORAL | Status: DC | PRN
  Administered 2022-04-27: 30 mL via ORAL

## 2022-04-27 MED ORDER — PANTOPRAZOLE SODIUM 40 MG PO TBEC: 40.0000 mg | DELAYED_RELEASE_TABLET | Freq: Every day | ORAL | Status: DC

## 2022-04-27 MED ORDER — KETOROLAC TROMETHAMINE 30 MG/ML IJ SOLN
INTRAMUSCULAR | Status: DC | PRN
  Administered 2022-04-27: 30 mg via INTRAVENOUS

## 2022-04-27 MED ORDER — HYDROMORPHONE HCL 1 MG/ML IJ SOLN
INTRAMUSCULAR | Status: DC | PRN
  Administered 2022-04-27: .5 mg via INTRAVENOUS
  Administered 2022-04-27: 1 mg via INTRAVENOUS
  Administered 2022-04-27: .5 mg via INTRAVENOUS

## 2022-04-27 MED ORDER — PHENYLEPHRINE 80 MCG/ML (10ML) SYRINGE FOR IV PUSH (FOR BLOOD PRESSURE SUPPORT)
PREFILLED_SYRINGE | INTRAVENOUS | Status: DC | PRN
  Administered 2022-04-27 (×4): 80 ug via INTRAVENOUS

## 2022-04-27 MED ORDER — HYDROMORPHONE 1 MG/ML IV SOLN: INTRAVENOUS | Status: DC

## 2022-04-27 MED ORDER — HYDROMORPHONE HCL 1 MG/ML IJ SOLN: 0.2000 mg | INTRAMUSCULAR | Status: DC | PRN

## 2022-04-27 MED ORDER — IRON 240 (27 FE) MG PO TABS: ORAL_TABLET | Freq: Every day | ORAL | Status: DC

## 2022-04-27 MED ORDER — ACETAMINOPHEN 500 MG PO TABS
1000.0000 mg | ORAL_TABLET | ORAL | Status: AC
  Administered 2022-04-27: 1000 mg via ORAL

## 2022-04-27 MED ORDER — SODIUM CHLORIDE 0.9% FLUSH: 9.0000 mL | INTRAVENOUS | Status: DC | PRN

## 2022-04-27 MED ORDER — PHENYLEPHRINE 80 MCG/ML (10ML) SYRINGE FOR IV PUSH (FOR BLOOD PRESSURE SUPPORT)
PREFILLED_SYRINGE | INTRAVENOUS | Status: AC
  Filled 2022-04-27: qty 10

## 2022-04-27 MED ORDER — IBUPROFEN 800 MG PO TABS
ORAL_TABLET | ORAL | Status: AC
  Filled 2022-04-27: qty 1

## 2022-04-27 MED ORDER — HYDROMORPHONE HCL 2 MG/ML IJ SOLN
INTRAMUSCULAR | Status: AC
  Filled 2022-04-27: qty 1

## 2022-04-27 MED ORDER — LACTATED RINGERS IV SOLN: INTRAVENOUS | Status: DC

## 2022-04-27 MED ORDER — LIDOCAINE 20MG/ML (2%) 15 ML SYRINGE OPTIME
INTRAMUSCULAR | Status: DC | PRN
  Administered 2022-04-27: 1.5 mg/kg/h via INTRAVENOUS

## 2022-04-27 MED ORDER — DEXTROMETHORPHAN POLISTIREX ER 30 MG/5ML PO SUER: 30.0000 mg | Freq: Two times a day (BID) | ORAL | Status: DC | PRN

## 2022-04-27 MED ORDER — LIDOCAINE HCL (PF) 2 % IJ SOLN
INTRAMUSCULAR | Status: AC
  Filled 2022-04-27: qty 10

## 2022-04-27 MED ORDER — MIDAZOLAM HCL 2 MG/2ML IJ SOLN
INTRAMUSCULAR | Status: AC
  Filled 2022-04-27: qty 2

## 2022-04-27 MED ORDER — IBUPROFEN 800 MG PO TABS
800.0000 mg | ORAL_TABLET | Freq: Three times a day (TID) | ORAL | Status: DC | PRN
  Administered 2022-04-27: 800 mg via ORAL

## 2022-04-27 MED ORDER — AMISULPRIDE (ANTIEMETIC) 5 MG/2ML IV SOLN: 10.0000 mg | Freq: Once | INTRAVENOUS | Status: DC | PRN

## 2022-04-27 MED ORDER — OXYCODONE HCL 5 MG/5ML PO SOLN: 5.0000 mg | Freq: Once | ORAL | Status: DC | PRN

## 2022-04-27 MED ORDER — OXYCODONE-ACETAMINOPHEN 5-325 MG PO TABS: 1.0000 | ORAL_TABLET | Freq: Four times a day (QID) | ORAL | 0 refills | Status: DC | PRN

## 2022-04-27 MED ORDER — SODIUM CHLORIDE 0.9 % IR SOLN
Status: DC | PRN
  Administered 2022-04-27: 1000 mL

## 2022-04-27 MED ORDER — ONDANSETRON HCL 4 MG PO TABS: 4.0000 mg | ORAL_TABLET | Freq: Four times a day (QID) | ORAL | Status: DC | PRN

## 2022-04-27 MED ORDER — ONDANSETRON HCL 4 MG/2ML IJ SOLN
INTRAMUSCULAR | Status: AC
  Filled 2022-04-27: qty 2

## 2022-04-27 MED ORDER — ONDANSETRON HCL 4 MG/2ML IJ SOLN: 4.0000 mg | Freq: Four times a day (QID) | INTRAMUSCULAR | Status: DC | PRN

## 2022-04-27 MED ORDER — DIPHENHYDRAMINE HCL 50 MG/ML IJ SOLN: 12.5000 mg | Freq: Four times a day (QID) | INTRAMUSCULAR | Status: DC | PRN

## 2022-04-27 MED ORDER — POVIDONE-IODINE 10 % EX SWAB: 2.0000 | Freq: Once | CUTANEOUS | Status: DC

## 2022-04-27 MED ORDER — CALCIUM CARBONATE ANTACID 500 MG PO CHEW: 1.0000 | CHEWABLE_TABLET | Freq: Three times a day (TID) | ORAL | Status: DC | PRN

## 2022-04-27 MED ORDER — KETOROLAC TROMETHAMINE 30 MG/ML IJ SOLN
INTRAMUSCULAR | Status: AC
  Filled 2022-04-27: qty 1

## 2022-04-27 MED ORDER — SIMETHICONE 80 MG PO CHEW: 80.0000 mg | CHEWABLE_TABLET | Freq: Four times a day (QID) | ORAL | Status: DC | PRN

## 2022-04-27 MED ORDER — OXYCODONE HCL 5 MG PO TABS
ORAL_TABLET | ORAL | Status: AC
  Filled 2022-04-27: qty 1

## 2022-04-27 MED ORDER — ALBUTEROL SULFATE HFA 108 (90 BASE) MCG/ACT IN AERS: 2.0000 | INHALATION_SPRAY | RESPIRATORY_TRACT | Status: DC | PRN

## 2022-04-27 MED ORDER — PROPOFOL 10 MG/ML IV BOLUS
INTRAVENOUS | Status: DC | PRN
  Administered 2022-04-27: 160 mg via INTRAVENOUS

## 2022-04-27 MED ORDER — LIDOCAINE 2% (20 MG/ML) 5 ML SYRINGE
INTRAMUSCULAR | Status: DC | PRN
  Administered 2022-04-27: 40 mg via INTRAVENOUS

## 2022-04-27 MED ORDER — ALUM & MAG HYDROXIDE-SIMETH 200-200-20 MG/5ML PO SUSP: 30.0000 mL | ORAL | Status: DC | PRN

## 2022-04-27 MED ORDER — SCOPOLAMINE 1 MG/3DAYS TD PT72
MEDICATED_PATCH | TRANSDERMAL | Status: AC
  Filled 2022-04-27: qty 1

## 2022-04-27 MED ORDER — GENTAMICIN SULFATE 40 MG/ML IJ SOLN
5.0000 mg/kg | INTRAVENOUS | Status: AC
  Administered 2022-04-27: 390 mg via INTRAVENOUS
  Filled 2022-04-27: qty 9.75

## 2022-04-27 MED ORDER — ROCURONIUM BROMIDE 10 MG/ML (PF) SYRINGE
PREFILLED_SYRINGE | INTRAVENOUS | Status: DC | PRN
  Administered 2022-04-27 (×2): 10 mg via INTRAVENOUS
  Administered 2022-04-27: 50 mg via INTRAVENOUS

## 2022-04-27 MED ORDER — ACETAMINOPHEN 500 MG PO TABS
ORAL_TABLET | ORAL | Status: AC
  Filled 2022-04-27: qty 2

## 2022-04-27 MED ORDER — CLINDAMYCIN PHOSPHATE 900 MG/50ML IV SOLN
INTRAVENOUS | Status: AC
  Filled 2022-04-27: qty 50

## 2022-04-27 MED ORDER — OXYCODONE-ACETAMINOPHEN 5-325 MG PO TABS
ORAL_TABLET | ORAL | Status: AC
  Filled 2022-04-27: qty 1

## 2022-04-27 MED ORDER — GABAPENTIN 300 MG PO CAPS
ORAL_CAPSULE | ORAL | Status: AC
  Filled 2022-04-27: qty 1

## 2022-04-27 MED ORDER — CLINDAMYCIN PHOSPHATE 900 MG/50ML IV SOLN
900.0000 mg | INTRAVENOUS | Status: AC
  Administered 2022-04-27: 900 mg via INTRAVENOUS

## 2022-04-27 MED ORDER — SUGAMMADEX SODIUM 200 MG/2ML IV SOLN
INTRAVENOUS | Status: DC | PRN
  Administered 2022-04-27: 200 mg via INTRAVENOUS

## 2022-04-27 MED ORDER — LACTATED RINGERS IV SOLN
INTRAVENOUS | Status: DC
  Administered 2022-04-27: 1000 mL via INTRAVENOUS

## 2022-04-27 MED ORDER — KETAMINE HCL 10 MG/ML IJ SOLN
INTRAMUSCULAR | Status: DC | PRN
  Administered 2022-04-27 (×3): 10 mg via INTRAVENOUS
  Administered 2022-04-27: 20 mg via INTRAVENOUS

## 2022-04-27 MED ORDER — NALOXONE HCL 0.4 MG/ML IJ SOLN: 0.4000 mg | INTRAMUSCULAR | Status: DC | PRN

## 2022-04-27 MED ORDER — BUPIVACAINE HCL (PF) 0.25 % IJ SOLN
INTRAMUSCULAR | Status: DC | PRN
  Administered 2022-04-27: 16 mL

## 2022-04-27 MED ORDER — MENTHOL 3 MG MT LOZG: 1.0000 | LOZENGE | OROMUCOSAL | Status: DC | PRN

## 2022-04-27 MED ORDER — KETAMINE HCL 50 MG/5ML IJ SOSY
PREFILLED_SYRINGE | INTRAMUSCULAR | Status: AC
  Filled 2022-04-27: qty 5

## 2022-04-27 MED ORDER — OXYCODONE-ACETAMINOPHEN 5-325 MG PO TABS
1.0000 | ORAL_TABLET | ORAL | Status: DC | PRN
  Administered 2022-04-27: 1 via ORAL

## 2022-04-27 MED ORDER — IBUPROFEN 800 MG PO TABS: 800.0000 mg | ORAL_TABLET | Freq: Three times a day (TID) | ORAL | 1 refills | Status: DC | PRN

## 2022-04-27 MED ORDER — FENTANYL CITRATE (PF) 100 MCG/2ML IJ SOLN
INTRAMUSCULAR | Status: DC | PRN
  Administered 2022-04-27: 100 ug via INTRAVENOUS

## 2022-04-27 MED ORDER — FENTANYL CITRATE (PF) 100 MCG/2ML IJ SOLN: 25.0000 ug | INTRAMUSCULAR | Status: DC | PRN

## 2022-04-27 MED ORDER — DEXAMETHASONE SODIUM PHOSPHATE 10 MG/ML IJ SOLN
INTRAMUSCULAR | Status: DC | PRN
  Administered 2022-04-27: 10 mg via INTRAVENOUS

## 2022-04-27 MED ORDER — DEXAMETHASONE SODIUM PHOSPHATE 10 MG/ML IJ SOLN
INTRAMUSCULAR | Status: AC
  Filled 2022-04-27: qty 1

## 2022-04-27 MED ORDER — STERILE WATER FOR IRRIGATION IR SOLN
Status: DC | PRN
  Administered 2022-04-27: 500 mL

## 2022-04-27 MED ORDER — FENTANYL CITRATE (PF) 100 MCG/2ML IJ SOLN
INTRAMUSCULAR | Status: AC
  Filled 2022-04-27: qty 2

## 2022-04-27 MED ORDER — ALUM & MAG HYDROXIDE-SIMETH 200-200-20 MG/5ML PO SUSP
ORAL | Status: AC
  Filled 2022-04-27: qty 30

## 2022-04-27 MED ORDER — MIDAZOLAM HCL 5 MG/5ML IJ SOLN
INTRAMUSCULAR | Status: DC | PRN
  Administered 2022-04-27: 2 mg via INTRAVENOUS

## 2022-04-27 MED ORDER — ONDANSETRON HCL 4 MG/2ML IJ SOLN
INTRAMUSCULAR | Status: DC | PRN
  Administered 2022-04-27: 4 mg via INTRAVENOUS

## 2022-04-27 MED ORDER — PROPOFOL 10 MG/ML IV BOLUS
INTRAVENOUS | Status: AC
  Filled 2022-04-27: qty 20

## 2022-04-27 MED ORDER — ROCURONIUM BROMIDE 10 MG/ML (PF) SYRINGE
PREFILLED_SYRINGE | INTRAVENOUS | Status: AC
  Filled 2022-04-27: qty 10

## 2022-04-27 MED ORDER — GABAPENTIN 300 MG PO CAPS
300.0000 mg | ORAL_CAPSULE | ORAL | Status: AC
  Administered 2022-04-27: 300 mg via ORAL

## 2022-04-27 MED ORDER — SCOPOLAMINE 1 MG/3DAYS TD PT72
1.0000 | MEDICATED_PATCH | TRANSDERMAL | Status: DC
  Administered 2022-04-27: 1.5 mg via TRANSDERMAL

## 2022-04-27 MED ORDER — DIPHENHYDRAMINE HCL 12.5 MG/5ML PO ELIX: 12.5000 mg | ORAL_SOLUTION | Freq: Four times a day (QID) | ORAL | Status: DC | PRN

## 2022-04-27 SURGICAL SUPPLY — 87 items
ADH SKN CLS APL DERMABOND .7 (GAUZE/BANDAGES/DRESSINGS) ×2
APL SKNCLS STERI-STRIP NONHPOA (GAUZE/BANDAGES/DRESSINGS) ×2
APL SRG 38 LTWT LNG FL B (MISCELLANEOUS)
APPLICATOR ARISTA FLEXITIP XL (MISCELLANEOUS) IMPLANT
BENZOIN TINCTURE PRP APPL 2/3 (GAUZE/BANDAGES/DRESSINGS) IMPLANT
CANNULA CAP OBTURATR AIRSEAL 8 (CAP) ×2 IMPLANT
CANNULA REDUC XI 12-8 STAPL (CANNULA)
CANNULA REDUCER 12-8 DVNC XI (CANNULA) IMPLANT
CATH FOLEY 3WAY  5CC 16FR (CATHETERS) ×2
CATH FOLEY 3WAY 5CC 16FR (CATHETERS) ×2 IMPLANT
CELLS DAT CNTRL 66122 CELL SVR (MISCELLANEOUS) ×2 IMPLANT
COVER BACK TABLE 60X90IN (DRAPES) ×2 IMPLANT
COVER SURGICAL LIGHT HANDLE (MISCELLANEOUS) IMPLANT
COVER TIP SHEARS 8 DVNC (MISCELLANEOUS) ×2 IMPLANT
COVER TIP SHEARS 8MM DA VINCI (MISCELLANEOUS) ×2
DEFOGGER SCOPE WARMER CLEARIFY (MISCELLANEOUS) ×2 IMPLANT
DERMABOND ADVANCED .7 DNX12 (GAUZE/BANDAGES/DRESSINGS) ×2 IMPLANT
DRAPE ARM DVNC X/XI (DISPOSABLE) ×8 IMPLANT
DRAPE COLUMN DVNC XI (DISPOSABLE) ×2 IMPLANT
DRAPE DA VINCI XI ARM (DISPOSABLE) ×8
DRAPE DA VINCI XI COLUMN (DISPOSABLE) ×2
DRAPE SURG IRRIG POUCH 19X23 (DRAPES) ×2 IMPLANT
DRAPE UTILITY 15X26 TOWEL STRL (DRAPES) ×2 IMPLANT
DRSG OPSITE POSTOP 4X10 (GAUZE/BANDAGES/DRESSINGS) IMPLANT
DURAPREP 26ML APPLICATOR (WOUND CARE) ×2 IMPLANT
ELECT REM PT RETURN 9FT ADLT (ELECTROSURGICAL) ×2
ELECTRODE REM PT RTRN 9FT ADLT (ELECTROSURGICAL) ×2 IMPLANT
GAUZE 4X4 16PLY ~~LOC~~+RFID DBL (SPONGE) ×2 IMPLANT
GLOVE BIO SURGEON STRL SZ 6.5 (GLOVE) ×6 IMPLANT
HEMOSTAT ARISTA ABSORB 3G PWDR (HEMOSTASIS) IMPLANT
HOLDER FOLEY CATH W/STRAP (MISCELLANEOUS) IMPLANT
IRRIG SUCT STRYKERFLOW 2 WTIP (MISCELLANEOUS) ×2
IRRIGATION SUCT STRKRFLW 2 WTP (MISCELLANEOUS) ×2 IMPLANT
KIT PINK PAD W/HEAD ARE REST (MISCELLANEOUS) ×2
KIT PINK PAD W/HEAD ARM REST (MISCELLANEOUS) ×2 IMPLANT
KIT TURNOVER CYSTO (KITS) ×2 IMPLANT
LEGGING LITHOTOMY PAIR STRL (DRAPES) ×2 IMPLANT
MANIFOLD NEPTUNE II (INSTRUMENTS) ×2 IMPLANT
MANIPULATOR ADVINCU DEL 2.5 PL (MISCELLANEOUS) IMPLANT
MANIPULATOR ADVINCU DEL 3.0 PL (MISCELLANEOUS) IMPLANT
MANIPULATOR ADVINCU DEL 3.5 PL (MISCELLANEOUS) IMPLANT
MANIPULATOR ADVINCU DEL 4.0 PL (MISCELLANEOUS) IMPLANT
NDL INSUFFLATION 14GA 120MM (NEEDLE) ×2 IMPLANT
NEEDLE INSUFFLATION 14GA 120MM (NEEDLE) ×2 IMPLANT
OBTURATOR OPTICAL STANDARD 8MM (TROCAR) ×2
OBTURATOR OPTICAL STND 8 DVNC (TROCAR) ×2
OBTURATOR OPTICALSTD 8 DVNC (TROCAR) ×2 IMPLANT
OCCLUDER COLPOPNEUMO (BALLOONS) ×2 IMPLANT
PACK ROBOT WH (CUSTOM PROCEDURE TRAY) ×2 IMPLANT
PACK ROBOTIC GOWN (GOWN DISPOSABLE) ×2 IMPLANT
PAD OB MATERNITY 4.3X12.25 (PERSONAL CARE ITEMS) ×2 IMPLANT
PAD PREP 24X48 CUFFED NSTRL (MISCELLANEOUS) ×2 IMPLANT
PROTECTOR NERVE ULNAR (MISCELLANEOUS) ×2 IMPLANT
RETRACTOR WND ALEXIS 18 MED (MISCELLANEOUS) IMPLANT
RTRCTR WOUND ALEXIS 18CM MED (MISCELLANEOUS) ×2
RTRCTR WOUND ALEXIS 18CM SML (INSTRUMENTS)
SAVER CELL AAL HAEMONETICS (INSTRUMENTS) IMPLANT
SEAL CANN UNIV 5-8 DVNC XI (MISCELLANEOUS) ×4 IMPLANT
SEAL XI 5MM-8MM UNIVERSAL (MISCELLANEOUS) ×4
SEALER VESSEL DA VINCI XI (MISCELLANEOUS) ×2
SEALER VESSEL EXT DVNC XI (MISCELLANEOUS) IMPLANT
SET IRRIG Y TYPE TUR BLADDER L (SET/KITS/TRAYS/PACK) IMPLANT
SET TUBE FILTERED XL AIRSEAL (SET/KITS/TRAYS/PACK) ×2 IMPLANT
SPIKE FLUID TRANSFER (MISCELLANEOUS) ×4 IMPLANT
SPONGE T-LAP 18X18 ~~LOC~~+RFID (SPONGE) IMPLANT
STAPLER CANNULA SEAL DVNC XI (STAPLE) IMPLANT
STAPLER CANNULA SEAL XI (STAPLE)
STRIP CLOSURE SKIN 1/2X4 (GAUZE/BANDAGES/DRESSINGS) IMPLANT
SUT PLAIN 2 0 XLH (SUTURE) IMPLANT
SUT VIC AB 0 CT1 27 (SUTURE)
SUT VIC AB 0 CT1 27XBRD ANBCTR (SUTURE) IMPLANT
SUT VIC AB 0 CT1 36 (SUTURE) IMPLANT
SUT VIC AB 2-0 CT1 27 (SUTURE) ×2
SUT VIC AB 2-0 CT1 TAPERPNT 27 (SUTURE) IMPLANT
SUT VIC AB 4-0 KS 27 (SUTURE) IMPLANT
SUT VIC AB 4-0 PS2 18 (SUTURE) ×6 IMPLANT
SUT VICRYL 0 UR6 27IN ABS (SUTURE) IMPLANT
SUT VLOC 180 0 9IN  GS21 (SUTURE) ×4
SUT VLOC 180 0 9IN GS21 (SUTURE) ×2 IMPLANT
TIP RUMI ORANGE 6.7MMX12CM (TIP) IMPLANT
TIP UTERINE 5.1X6CM LAV DISP (MISCELLANEOUS) IMPLANT
TIP UTERINE 6.7X10CM GRN DISP (MISCELLANEOUS) IMPLANT
TIP UTERINE 6.7X6CM WHT DISP (MISCELLANEOUS) IMPLANT
TIP UTERINE 6.7X8CM BLUE DISP (MISCELLANEOUS) IMPLANT
TOWEL OR 17X26 10 PK STRL BLUE (TOWEL DISPOSABLE) ×2 IMPLANT
TROCAR PORT AIRSEAL 8X100 (TROCAR) IMPLANT
WATER STERILE IRR 1000ML POUR (IV SOLUTION) ×2 IMPLANT

## 2022-04-27 NOTE — Progress Notes (Signed)
Day of Surgery Procedure(s) (LRB): XI ROBOTIC ASSISTED LAPAROSCOPIC HYSTERECTOMY AND SALPINGECTOMY/MINI LAPAROTOMY (Bilateral) CYSTOSCOPY (N/A) IUD removal  Subjective: Patient reports incisional pain and tolerating PO.  Awaiting void.  Pt's pain well-controlled.  Ambulating.  Able to tolerate po, some heartburn after Hansel Starling!  Objective: I have reviewed patient's vital signs, intake and output, medications, and labs.  General: alert and no distress Resp: clear to auscultation bilaterally Cardio: regular rate and rhythm GI: soft, non-tender; bowel sounds normal; no masses,  no organomegaly Extremities: extremities normal, atraumatic, no cyanosis or edema Inc - C/D/I, some drainage on honeycomb  Assessment: s/p Procedure(s): XI ROBOTIC ASSISTED LAPAROSCOPIC HYSTERECTOMY AND SALPINGECTOMY/MINI LAPAROTOMY (Bilateral) CYSTOSCOPY (N/A): stable and progressing well  Plan: Encourage ambulation Discharge home when voiding, tolerating po, pain controlled and ambulating well F/u 2 and 6 weeks  LOS: 0 days    Alicia Contes, MD 04/27/2022, 5:23 PM

## 2022-04-27 NOTE — Progress Notes (Signed)
Support person phoned and left message that all is well in OR.

## 2022-04-27 NOTE — Interval H&P Note (Signed)
History and Physical Interval Note:  04/27/2022 6:50 AM  Alicia Wells  has presented today for surgery, with the diagnosis of leiomyoma of uterus.  The various methods of treatment have been discussed with the patient and family. After consideration of risks, benefits and other options for treatment, the patient has consented to  Procedure(s): XI ROBOTIC ASSISTED LAPAROSCOPIC HYSTERECTOMY AND SALPINGECTOMY (Bilateral) CYSTOSCOPY (N/A) as a surgical intervention.  The patient's history has been reviewed, patient examined, no change in status, stable for surgery.  I have reviewed the patient's chart and labs.  Questions were answered to the patient's satisfaction.     [name]

## 2022-04-27 NOTE — Transfer of Care (Signed)
Immediate Anesthesia Transfer of Care Note  Patient: Alicia Wells  Procedure(s) Performed: XI ROBOTIC ASSISTED LAPAROSCOPIC HYSTERECTOMY AND SALPINGECTOMY/MINI LAPAROTOMY (Bilateral: Pelvis) CYSTOSCOPY  Patient Location: PACU  Anesthesia Type:General  Level of Consciousness: awake, alert , oriented, and patient cooperative  Airway & Oxygen Therapy: Patient Spontanous Breathing and Patient connected to nasal cannula oxygen  Post-op Assessment: Report given to RN and Post -op Vital signs reviewed and stable  Post vital signs: Reviewed and stable  Last Vitals:  Vitals Value Taken Time  BP 131/84 04/27/22 1131  Temp    Pulse 71 04/27/22 1133  Resp 11 04/27/22 1133  SpO2 95 % 04/27/22 1133  Vitals shown include unvalidated device data.  Last Pain:  Vitals:   04/27/22 0547  TempSrc: Oral      Patients Stated Pain Goal: 5 (69/86/14 8307)  Complications: No notable events documented.

## 2022-04-27 NOTE — Brief Op Note (Signed)
04/27/2022  11:22 AM  PATIENT:  Alicia Wells  42 y.o. female  PRE-OPERATIVE DIAGNOSIS:  leiomyoma of uterus  POST-OPERATIVE DIAGNOSIS:  leiomyoma of uterus  PROCEDURE:  Procedure(s): XI ROBOTIC ASSISTED LAPAROSCOPIC HYSTERECTOMY AND SALPINGECTOMY/MINI LAPAROTOMY (Bilateral), Loopherectomy  SURGEON:  Surgeon(s) and Role:    * Bovard-Stuckert, Envy Meno, MD - Primary  ASSISTANTS: Gaylord Shih RNFA   ANESTHESIA:   local and general  EBL:  100 mL uop and IV per anesthesia, clear urine  DRAINS: Urinary Catheter (Foley)   LOCAL MEDICATIONS USED:  MARCAINE     SPECIMEN:  Source of Specimen:  morcellated uterus, B fallopian tubes and Left ovary  DISPOSITION OF SPECIMEN:  PATHOLOGY  COUNTS:  YES  TOURNIQUET:  * No tourniquets in log *  DICTATION: .Other Dictation: Dictation Number [id number]  PLAN OF CARE:  extended recovery  PATIENT DISPOSITION:  PACU - hemodynamically stable.   Delay start of Pharmacological VTE agent (>24hrs) due to surgical blood loss or risk of bleeding: not applicable

## 2022-04-27 NOTE — Op Note (Unsigned)
Alicia Wells, Alicia Wells MEDICAL RECORD NO: [id number] ACCOUNT NO: [id number] DATE OF BIRTH: 1980/10/10 FACILITY: [facility] LOCATION: [facility] PHYSICIAN: Janyth Contes, MD  Operative Report   DATE OF PROCEDURE: 04/27/2022  PREOPERATIVE DIAGNOSIS:  Leiomyoma of the uterus.  POSTOPERATIVE DIAGNOSIS:  Leiomyoma of the uterus.  PROCEDURE:  Da Vinci Mechele Claude robot-assisted laparoscopic hysterectomy and bilateral salpingectomy with left oophorectomy and a mini laparotomy for removal of the uterus.  SURGEON:  Janyth Contes, M.D.  ASSISTANT: Anson Crofts, RNFA  COMPLICATIONS: Multiple fibroid uterus weighing approximately 775 grams, necessitating a mini laparotomy.  PATHOLOGY: Uterus and cervix with bilateral fallopian tubes and left ovary.  ESTIMATED BLOOD LOSS:  100 mL  IV FLUIDS AND URINE OUTPUT:  Per anesthesia with clear urine at the end of the procedure.  DESCRIPTION OF PROCEDURE:  After informed consent was reviewed with the patient including risks, benefits and alternatives of surgical procedure she was transported to the operating room and placed on the table in supine position.  General anesthesia was  induced and found to be adequate.  An Chapin manipulator was placed on the uterus and a Foley catheter was also placed.  Gloves and gown were changed.  Attention was turned to placing the ports.  The midline port was placed approximately 4  fingerbreadths above the umbilicus, the skin was incised and a Veress needle was used to obtain pneumoperitoneum after passing the hanging drop test with opening pressure of 2 mmHg.  The trocar was placed.  Accessory ports were placed on both the right  and left.  After visualization of the pelvis revealing a 14-16 week size uterus.  The accessory ports were placed under direct visualization.  Attention was turned to the left side, the fimbriated end of the tube was grasped and the tube was excised to  the level of the cornu.  The ovary  was also adhesed and was excised.  The round ligaments were ligated and the cardinal ligaments were excised to the level of the uterine artery.  Attention was turned to the right side, which, in a similar fashion, the  fimbriated end of the right tube was found.  The tube was excised to the level of the cornu.  The round ligament was excised to the level of the uterine arteries.  The cardinal ligaments were also ligated.  The uterine arteries were ligated bilaterally.   The colpotomy was started anteriorly and the entire cervix was circumscribed.  There was difficulty due to the size of the uterus.  The multiple fibroids made maneuvering hard and at times a 30-degree camera was also used rather than relying on  0-degree camera.  The attempt was made to remove the uterus through the vagina.  The uterus was then attempted to be bivalved to remove it.  This was abandoned due to the size of the uterus and the time it would take, the decision was made to proceed  with a mini laparotomy.  The miniPfannenstiel incision was made approximately 2 fingerbreadths above the pubic symphysis.  Incision was made sharply, carried through to the underlying layer of fascia.  Fascia was cleaned off and the fascia was incised in the  midline.  The incision was extended laterally with Mayo scissors.  Superior aspect of the fascial incision was grasped with Kocher clamps and elevated, the rectus muscles were dissected off both bluntly and sharply.  Midline was easily identified.   Peritoneum was entered bluntly.  An Alexis skin retractor was placed carefully making sure no bowel was  entrapped and the uterus was morcellated at the skin using a thyroid tenaculum.  It was removed in its entirety.  Hemostasis was assured.  Pelvic  irrigation was performed.  The peritoneum was reapproximated with 2-0 Vicryl.  The fascia was reapproximated with 0 Vicryl in a running suture, subcuticular adipose layer was made hemostatic and closed with  plain gut.  The skin was closed with 4-0 Vicryl  in a running fashion.  Benzoin and Steris were applied.  The other external ports were closed.  The patient tolerated the procedure well.  Sponge, lap, and needle count was correct x2 per the operating staff.   SHY D: 04/27/2022 11:30:55 am T: 04/27/2022 12:01:00 pm  JOB: [id number]/ [id number]

## 2022-04-27 NOTE — Anesthesia Procedure Notes (Signed)
Procedure Name: Intubation Date/Time: 04/27/2022 7:24 AM  Performed by: Allenmichael Mcpartlin D, CRNAPre-anesthesia Checklist: Patient identified, Emergency Drugs available, Suction available and Patient being monitored Patient Re-evaluated:Patient Re-evaluated prior to induction Oxygen Delivery Method: Circle system utilized Preoxygenation: Pre-oxygenation with 100% oxygen Induction Type: IV induction Ventilation: Mask ventilation without difficulty Tube type: Oral Tube size: 7.0 mm Number of attempts: 1 Airway Equipment and Method: Stylet Placement Confirmation: ETT inserted through vocal cords under direct vision, positive ETCO2 and breath sounds checked- equal and bilateral Secured at: 21 cm Tube secured with: Tape Dental Injury: Teeth and Oropharynx as per pre-operative assessment

## 2022-04-27 NOTE — Anesthesia Preprocedure Evaluation (Signed)
Anesthesia Evaluation  Patient identified by MRN, date of birth, ID band Patient awake    Reviewed: Allergy & Precautions, NPO status , Patient's Chart, lab work & pertinent test results  Airway Mallampati: II  TM Distance: >3 FB Neck ROM: Full    Dental  (+) Dental Advisory Given   Pulmonary asthma , former smoker   breath sounds clear to auscultation       Cardiovascular negative cardio ROS  Rhythm:Regular Rate:Normal     Neuro/Psych  Headaches    GI/Hepatic Neg liver ROS,GERD  ,,  Endo/Other  negative endocrine ROS    Renal/GU negative Renal ROS     Musculoskeletal   Abdominal   Peds  Hematology negative hematology ROS (+)   Anesthesia Other Findings   Reproductive/Obstetrics                             Anesthesia Physical Anesthesia Plan  ASA: 2  Anesthesia Plan: General   Post-op Pain Management: Tylenol PO (pre-op)*, Gabapentin PO (pre-op)* and Toradol IV (intra-op)*   Induction: Intravenous  PONV Risk Score and Plan: 4 or greater and Dexamethasone, Ondansetron, Midazolam and Scopolamine patch - Pre-op  Airway Management Planned: Oral ETT  Additional Equipment:   Intra-op Plan:   Post-operative Plan: Extubation in OR  Informed Consent: I have reviewed the patients History and Physical, chart, labs and discussed the procedure including the risks, benefits and alternatives for the proposed anesthesia with the patient or authorized representative who has indicated his/her understanding and acceptance.     Dental advisory given  Plan Discussed with: CRNA  Anesthesia Plan Comments:        Anesthesia Quick Evaluation

## 2022-04-28 ENCOUNTER — Encounter (HOSPITAL_BASED_OUTPATIENT_CLINIC_OR_DEPARTMENT_OTHER): Payer: Self-pay | Admitting: Obstetrics and Gynecology

## 2022-04-28 LAB — SURGICAL PATHOLOGY

## 2022-04-28 NOTE — Anesthesia Postprocedure Evaluation (Signed)
Anesthesia Post Note  Patient: Alicia Wells  Procedure(s) Performed: XI ROBOTIC ASSISTED LAPAROSCOPIC HYSTERECTOMY AND SALPINGECTOMY/MINI LAPAROTOMY (Bilateral: Pelvis) CYSTOSCOPY     Patient location during evaluation: PACU Anesthesia Type: General Level of consciousness: awake and alert Pain management: pain level controlled Vital Signs Assessment: post-procedure vital signs reviewed and stable Respiratory status: spontaneous breathing, nonlabored ventilation, respiratory function stable and patient connected to nasal cannula oxygen Cardiovascular status: blood pressure returned to baseline and stable Postop Assessment: no apparent nausea or vomiting Anesthetic complications: no   No notable events documented.  Last Vitals:  Vitals:   04/27/22 1554 04/27/22 1912  BP: 120/77 115/73  Pulse: 75 79  Resp: 13 16  Temp:  37 C  SpO2: 99% 99%    Last Pain:  Vitals:   04/27/22 1645  TempSrc:   PainSc: 4                  Tiajuana Amass

## 2022-04-28 NOTE — Discharge Summary (Signed)
Physician Discharge Summary  Patient ID: Alicia Wells MRN: [id number] DOB/AGE: [birth year] 42 y.o.  Admit date: 04/27/2022 Discharge date: 04/28/2022  Admission Diagnoses:  Discharge Diagnoses:  Principal Problem:   S/P robot-assisted surgical procedure   Discharged Condition: good  Hospital Course: admited for RA TLH, see separate operative note.  Postop course was uncomplicated - Day of surgery pt was ambulating, voiding, tolerating po and pain was well-controlled at time of discharge.  Discharged with routine postop instructions, ibuprofen and percocet - f/u 2 and 6 weeks  Consults: None  Significant Diagnostic Studies: labs: CBC, BMP  Treatments: IV hydration and surgery: RA TLH/BS, LO and removal of uterus thru minilap incision  Discharge Exam: Blood pressure 115/73, pulse 79, temperature 98.6 F (37 C), resp. rate 16, height '5\' 6"'$  (1.676 m), weight 76.8 kg, last menstrual period 04/08/2022, SpO2 99 %. General appearance: alert and no distress Resp: clear to auscultation bilaterally Cardio: regular rate and rhythm GI: soft, non-tender; bowel sounds normal; no masses,  no organomegaly Extremities: extremities normal, atraumatic, no cyanosis or edema Incision/Wound: C/D/I  Disposition: Discharge disposition: 01-Home or Self Care       Discharge Instructions     Call MD for:  persistant nausea and vomiting   Complete by: As directed    Call MD for:  redness, tenderness, or signs of infection (pain, swelling, redness, odor or green/yellow discharge around incision site)   Complete by: As directed    Call MD for:  severe uncontrolled pain   Complete by: As directed    Diet - low sodium heart healthy   Complete by: As directed    Discharge instructions   Complete by: As directed    Call [phone] with questions or problems   Driving Restrictions   Complete by: As directed    While taking strong pain medicine   Increase activity slowly   Complete by: As  directed    Lifting restrictions   Complete by: As directed    No greater than 10-15lbs for 6 weeks   May shower / Bathe   Complete by: As directed    May walk up steps   Complete by: As directed    Sexual Activity Restrictions   Complete by: As directed    Pelvic rest - no douching, tampons or sex for 6 weeks      Allergies as of 04/27/2022       Reactions   Penicillins Anaphylaxis   Rash, hives Has patient had a PCN reaction causing immediate rash, facial/tongue/throat swelling, SOB or lightheadedness with hypotension:  yes Has patient had a PCN reaction causing severe rash involving mucus membranes or skin necrosis:Unknown Has patient had a PCN reaction that required hospitalization: no Has patient had a PCN reaction occurring within the last 10 years: no If all of the above answers are "NO", then may proceed with Cephalosporin use.   Ethanol-alcohol [ethanol] Hives, Swelling   If patient drinks alcohol she has swelling in her lips and bumps on her arms.        Medication List     STOP taking these medications    Iron 240 (27 Fe) MG Tabs   naproxen sodium 275 MG tablet Commonly known as: ANAPROX   norethindrone-ethinyl estradiol-FE 1-20 MG-MCG tablet Commonly known as: LOESTRIN FE   predniSONE 10 MG tablet Commonly known as: DELTASONE       TAKE these medications    albuterol 108 (90 Base) MCG/ACT inhaler Commonly known as: ProAir HFA  inhale 2 puffs every 6 hours if needed for wheezing shortness of breath   ibuprofen 800 MG tablet Commonly known as: ADVIL Take 1 tablet (800 mg total) by mouth every 8 (eight) hours as needed for moderate pain. What changed:  when to take this reasons to take this   multivitamin with minerals Tabs tablet Take 1 tablet by mouth daily.   omeprazole 20 MG capsule Commonly known as: PRILOSEC TAKE 1 CAPSULE BY MOUTH EVERY DAY What changed:  when to take this reasons to take this   oxyCODONE-acetaminophen 5-325 MG  tablet Commonly known as: PERCOCET/ROXICET Take 1-2 tablets by mouth every 6 (six) hours as needed for severe pain (moderate to severe pain (when tolerating fluids)).   Vitamin D (Ergocalciferol) 1.25 MG (50000 UNIT) Caps capsule Commonly known as: DRISDOL Take 50,000 Units by mouth every 7 (seven) days.        Follow-up Information     Bovard-Stuckert, Eutimio Gharibian, MD. Schedule an appointment as soon as possible for a visit in 2 week(s).   Specialty: Obstetrics and Gynecology Why: 2 and 6 weeks for postop check Contact information: [address] [place] [address] [phone]                 Signed: Janyth Contes 04/28/2022, 12:47 AM

## 2022-12-28 ENCOUNTER — Other Ambulatory Visit: Payer: Self-pay | Admitting: Obstetrics and Gynecology

## 2022-12-28 ENCOUNTER — Encounter: Payer: Self-pay | Admitting: Obstetrics and Gynecology

## 2022-12-28 DIAGNOSIS — R928 Other abnormal and inconclusive findings on diagnostic imaging of breast: Secondary | ICD-10-CM

## 2023-01-19 ENCOUNTER — Ambulatory Visit
Admission: RE | Admit: 2023-01-19 | Discharge: 2023-01-19 | Disposition: A | Discharge status: Home / Self Care | Payer: 59 | Source: Ambulatory Visit | Attending: Obstetrics and Gynecology | Admitting: Obstetrics and Gynecology

## 2023-01-19 ENCOUNTER — Other Ambulatory Visit: Payer: Self-pay | Admitting: Obstetrics and Gynecology

## 2023-01-19 DIAGNOSIS — R928 Other abnormal and inconclusive findings on diagnostic imaging of breast: Secondary | ICD-10-CM

## 2023-01-19 DIAGNOSIS — N6489 Other specified disorders of breast: Secondary | ICD-10-CM

## 2023-01-21 ENCOUNTER — Encounter: Payer: Self-pay | Admitting: Obstetrics and Gynecology

## 2023-01-26 ENCOUNTER — Other Ambulatory Visit: Payer: 59

## 2023-06-02 ENCOUNTER — Encounter: Payer: Self-pay | Admitting: Family Medicine

## 2023-06-02 ENCOUNTER — Ambulatory Visit (INDEPENDENT_AMBULATORY_CARE_PROVIDER_SITE_OTHER): Payer: 59 | Admitting: Family Medicine

## 2023-06-02 VITALS — BP 118/80 | HR 76 | Temp 98.2°F | Ht 66.0 in | Wt 168.2 lb

## 2023-06-02 DIAGNOSIS — E663 Overweight: Secondary | ICD-10-CM

## 2023-06-02 DIAGNOSIS — J302 Other seasonal allergic rhinitis: Secondary | ICD-10-CM | POA: Diagnosis not present

## 2023-06-02 DIAGNOSIS — E559 Vitamin D deficiency, unspecified: Secondary | ICD-10-CM

## 2023-06-02 DIAGNOSIS — R519 Headache, unspecified: Secondary | ICD-10-CM | POA: Diagnosis not present

## 2023-06-02 MED ORDER — IBUPROFEN 800 MG PO TABS: 800.0000 mg | ORAL_TABLET | Freq: Three times a day (TID) | ORAL | 1 refills | Status: DC | PRN

## 2023-06-02 MED ORDER — CETIRIZINE HCL 10 MG PO TABS: 10.0000 mg | ORAL_TABLET | Freq: Every day | ORAL | 2 refills | Status: DC

## 2023-06-02 NOTE — Patient Instructions (Addendum)
-  It was nice to meet you and look forward to taking care of you. -Suspect your headaches are coming from seasonal allergies and environmental conditions that are causing irritation to your sinuses.  -Recommend to take Zyrtec 10mg  tablet every night. You may take Ibuprofen as needed for headaches. Refilled Ibuprofen.  -Recommend to hydrate 64-100oz of water a day.  -Ordered labs. Office will call with lab results and you will see them on MyChart.  -Follow up in 1 month.

## 2023-06-02 NOTE — Progress Notes (Signed)
 New Patient Office Visit  Subjective   Patient ID: Alicia Wells, female    DOB: 1980/05/11  Age: 43 y.o. MRN: [id number]  CC: Establish care and discuss about headaches    HPI Alicia Wells presents to establish care with new provider.   Patients previous primary care was Dr. Evelena Peat with [facility] [place]. Last seen 07/12/2018.   Specialist: Willodean Rosenthal with Dr. Sherron Monday   Patient is experiencing headaches. 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 She reports the headaches is mostly frontal around her sinuses, daily headache. She reports if she is not at work, she will not have headaches. Also, complains of eyes being red. Pain described as sharp and throbbing. She takes Ibuprofen and Zyrtec sometimes which helps some. Reports she got dizzy once last week, but besides that she has not had been dizzy, lightheaded, or blurry vision.   Outpatient Encounter Medications as of 06/02/2023  Medication Sig   cetirizine (ZYRTEC) 10 MG tablet Take 1 tablet (10 mg total) by mouth daily.   Cholecalciferol (VITAMIN D-3) 125 MCG (5000 UT) TABS Take 1 tablet by mouth daily.   Multiple Vitamin (MULTIVITAMIN WITH MINERALS) TABS tablet Take 1 tablet by mouth daily.   omeprazole (PRILOSEC) 20 MG capsule TAKE 1 CAPSULE BY MOUTH EVERY DAY (Patient taking differently: as needed. TAKE 1 CAPSULE BY MOUTH EVERY DAY)   [DISCONTINUED] ibuprofen (ADVIL) 800 MG tablet Take 1 tablet (800 mg total) by mouth every 8 (eight) hours as needed for moderate pain.   [DISCONTINUED] Vitamin D, Ergocalciferol, (DRISDOL) 1.25 MG (50000 UNIT) CAPS capsule Take 50,000 Units by mouth every 7 (seven) days.   ibuprofen (ADVIL) 800 MG tablet Take 1 tablet (800 mg total) by mouth every 8 (eight) hours as needed for moderate pain (pain score 4-6).   [DISCONTINUED] albuterol (PROAIR HFA) 108 (90 Base)  MCG/ACT inhaler inhale 2 puffs every 6 hours if needed for wheezing shortness of breath   [DISCONTINUED] oxyCODONE-acetaminophen (PERCOCET/ROXICET) 5-325 MG tablet Take 1-2 tablets by mouth every 6 (six) hours as needed for severe pain (moderate to severe pain (when tolerating fluids)).   No facility-administered encounter medications on file as of 06/02/2023.    Past Medical History:  Diagnosis Date   Anemia    from heavy menstrual bleeding / pt is taking daily iron supplements   Anxiety    resolved per pt   Asthma    mild intermittent, pt states she quit smoking and no longer needs inhaler   Blood transfusion without reported diagnosis    Depression    resolved per pt   GERD (gastroesophageal reflux disease)    takes Prilosec as needed   History of esophagogastroduodenoscopy (EGD) 02/01/2015   Uterine leiomyoma    Vitamin D deficiency     Past Surgical History:  Procedure Laterality Date   CYSTOSCOPY N/A 04/27/2022   Procedure: CYSTOSCOPY;  Surgeon: Sherian Rein, MD;  Location: [facility];  Service: Gynecology;  Laterality: N/A;   ROBOTIC ASSISTED LAPAROSCOPIC HYSTERECTOMY AND SALPINGECTOMY Bilateral 04/27/2022   Procedure: XI ROBOTIC ASSISTED LAPAROSCOPIC HYSTERECTOMY AND SALPINGECTOMY/MINI LAPAROTOMY;  Surgeon: Sherian Rein, MD;  Location: [facility];  Service: Gynecology;  Laterality: Bilateral;   VAGINAL HYSTERECTOMY  2024   Partial   WISDOM TOOTH EXTRACTION      Family History  Problem Relation Age of Onset   Diabetes Father  Asthma Maternal Uncle    Asthma Maternal Grandmother    Pancreatic disease Neg Hx    Colon cancer Neg Hx     Social History   Socioeconomic History   Marital status: Single    Spouse name: Not on file   Number of children: 0   Years of education: Not on file   Highest education level: Associate degree: occupational, Scientist, product/process development, or vocational program  Occupational History   Occupation:  employed    Comment: post Geologist, engineering  Tobacco Use   Smoking status: Former    Current packs/day: 0.00    Average packs/day: 0.3 packs/day for 15.2 years (3.8 ttl pk-yrs)    Types: Cigarettes    Start date: 01/12/2002    Quit date: 2019    Years since quitting: 6.1   Smokeless tobacco: Never  Vaping Use   Vaping status: Never Used  Substance and Sexual Activity   Alcohol use: No    Alcohol/week: 0.0 standard drinks of alcohol   Drug use: No   Sexual activity: Not Currently  Other Topics Concern   Not on file  Social History Narrative   Marital status: single     Children: none      Lives: with mom      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      Tobacco: 1 ppd since 2002      Alcohol: none      Drugs: none      Exercise:  none   Social Drivers of Corporate investment banker Strain: Low Risk  (06/02/2023)   Overall Financial Resource Strain (CARDIA)    Difficulty of Paying Living Expenses: Not hard at all  Food Insecurity: No Food Insecurity (06/02/2023)   Hunger Vital Sign    Worried About Running Out of Food in the Last Year: Never true    Ran Out of Food in the Last Year: Never true  Transportation Needs: No Transportation Needs (06/02/2023)   PRAPARE - Administrator, Civil Service (Medical): No    Lack of Transportation (Non-Medical): No  Physical Activity: Insufficiently Active (06/02/2023)   Exercise Vital Sign    Days of Exercise per Week: 3 days    Minutes of Exercise per Session: 30 min  Stress: No Stress Concern Present (06/02/2023)   [employer] of Occupational Health - Occupational Stress Questionnaire    Feeling of Stress : Only a little  Social Connections: Socially Isolated (06/02/2023)   Social Connection and Isolation Panel [NHANES]    Frequency of Communication with Friends and Family: More than three times a week    Frequency of Social Gatherings with Friends and Family: Three times a week    Attends Religious Services:  Never    Active Member of Clubs or Organizations: No    Attends Banker Meetings: Never    Marital Status: Never married  Intimate Partner Violence: Not At Risk (06/02/2023)   Humiliation, Afraid, Rape, and Kick questionnaire    Fear of Current or Ex-Partner: No    Emotionally Abused: No    Physically Abused: No    Sexually Abused: No    ROS See HPI above    Objective  BP 118/80   Pulse 76   Temp 98.2 F (36.8 C) (Oral)   Ht 5\' 6"  (1.676 m)   Wt 168 lb 3.2 oz (76.3 kg)   LMP 04/08/2022 (Exact Date)   SpO2 99%   BMI 27.15 kg/m  Physical Exam Vitals reviewed.  Constitutional:      General: She is not in acute distress.    Appearance: Normal appearance. She is not ill-appearing, toxic-appearing or diaphoretic.  HENT:     Head: Normocephalic and atraumatic.  Eyes:     General:        Right eye: No discharge.        Left eye: No discharge.     Conjunctiva/sclera: Conjunctivae normal.  Cardiovascular:     Rate and Rhythm: Normal rate and regular rhythm.     Heart sounds: Normal heart sounds. No murmur heard.    No friction rub. No gallop.  Pulmonary:     Effort: Pulmonary effort is normal. No respiratory distress.     Breath sounds: Normal breath sounds.  Musculoskeletal:        General: Normal range of motion.  Skin:    General: Skin is warm and dry.  Neurological:     General: No focal deficit present.     Mental Status: She is alert and oriented to person, place, and time. Mental status is at baseline.     Cranial Nerves: Cranial nerves 2-12 are intact. No facial asymmetry.     Sensory: Sensation is intact.     Motor: No weakness.  Psychiatric:        Mood and Affect: Mood normal.        Behavior: Behavior normal.        Thought Content: Thought content normal.        Judgment: Judgment normal.      Assessment & Plan:  Nonintractable episodic headache, unspecified headache type -     Cetirizine HCl; Take 1 tablet (10 mg total) by mouth  daily.  Dispense: 30 tablet; Refill: 2 -     Ibuprofen; Take 1 tablet (800 mg total) by mouth every 8 (eight) hours as needed for moderate pain (pain score 4-6).  Dispense: 45 tablet; Refill: 1 -     CBC with Differential/Platelet -     Comprehensive metabolic panel -     Magnesium -     TSH  Seasonal allergies -     Cetirizine HCl; Take 1 tablet (10 mg total) by mouth daily.  Dispense: 30 tablet; Refill: 2  Vitamin D deficiency -     VITAMIN D 25 Hydroxy (Vit-D Deficiency, Fractures)  Overweight -     Hemoglobin A1c -     Lipid panel   1.Review health maintenance:  -Cervical cancer screening: Prior to hysterectomy -Covid booster: Declines  -Hep C screening: Will obtain at next visit when labs are obtained.  -Influenza vaccine: Declines  -PNA vaccine: Declines  2.Suspect headaches are coming from seasonal allergies and environmental conditions that are causing irritation to her sinuses.  -Recommend to take Zyrtec 10mg  tablet every night. Sent in a prescription. Informed if insurance does not cover the cost, she will need to obtain over  the counter. Refilled Ibuprofen for headaches.  -Recommend to hydrate 64-100oz of water a day.  3.Ordered labs (vitamin D, TSH, Magnesium, CMP, CBC, lipid panel, and A1c) based on complaints of headache, prior history of vitamin D deficiency, and overweight .  -Follow up in 1 month.  Return in about 1 month (around 07/03/2023) for follow-up.   Zandra Abts, NP

## 2023-06-07 ENCOUNTER — Other Ambulatory Visit

## 2023-06-07 LAB — COMPREHENSIVE METABOLIC PANEL
ALT: 11 U/L (ref 0–35)
AST: 19 U/L (ref 0–37)
Albumin: 4.4 g/dL (ref 3.5–5.2)
Alkaline Phosphatase: 115 U/L (ref 39–117)
BUN: 12 mg/dL (ref 6–23)
CO2: 25 meq/L (ref 19–32)
Calcium: 9.8 mg/dL (ref 8.4–10.5)
Chloride: 103 meq/L (ref 96–112)
Creatinine, Ser: 0.9 mg/dL (ref 0.40–1.20)
GFR: 77.73 mL/min (ref >60.00)
Glucose, Bld: 92 mg/dL (ref 70–99)
Potassium: 4.2 meq/L (ref 3.5–5.1)
Sodium: 136 meq/L (ref 135–145)
Total Bilirubin: 0.4 mg/dL (ref 0.2–1.2)
Total Protein: 7.8 g/dL (ref 6.0–8.3)

## 2023-06-07 LAB — CBC WITH DIFFERENTIAL/PLATELET
Basophils Absolute: 0 10*3/uL (ref 0.0–0.1)
Basophils Relative: 0.5 % (ref 0.0–3.0)
Eosinophils Absolute: 0.1 10*3/uL (ref 0.0–0.7)
Eosinophils Relative: 1.4 % (ref 0.0–5.0)
HCT: 41.3 % (ref 36.0–46.0)
Hemoglobin: 13.8 g/dL (ref 12.0–15.0)
Lymphocytes Relative: 30.6 % (ref 12.0–46.0)
Lymphs Abs: 1.6 10*3/uL (ref 0.7–4.0)
MCHC: 33.3 g/dL (ref 30.0–36.0)
MCV: 85 fl (ref 78.0–100.0)
Monocytes Absolute: 0.4 10*3/uL (ref 0.1–1.0)
Monocytes Relative: 6.9 % (ref 3.0–12.0)
Neutro Abs: 3.1 10*3/uL (ref 1.4–7.7)
Neutrophils Relative %: 60.6 % (ref 43.0–77.0)
Platelets: 265 10*3/uL (ref 150.0–400.0)
RBC: 4.86 Mil/uL (ref 3.87–5.11)
RDW: 16.1 % — ABNORMAL HIGH (ref 11.5–15.5)
WBC: 5.2 10*3/uL (ref 4.0–10.5)

## 2023-06-07 LAB — LIPID PANEL
Cholesterol: 133 mg/dL (ref 0–200)
HDL: 65 mg/dL (ref >39.00)
LDL Cholesterol: 56 mg/dL (ref 0–99)
NonHDL: 68.3
Total CHOL/HDL Ratio: 2
Triglycerides: 61 mg/dL (ref 0.0–149.0)
VLDL: 12.2 mg/dL (ref 0.0–40.0)

## 2023-06-07 LAB — MAGNESIUM: Magnesium: 2.1 mg/dL (ref 1.5–2.5)

## 2023-06-07 LAB — HEMOGLOBIN A1C: Hgb A1c MFr Bld: 5.7 % (ref 4.6–6.5)

## 2023-06-07 LAB — VITAMIN D 25 HYDROXY (VIT D DEFICIENCY, FRACTURES): VITD: 93.98 ng/mL (ref 30.00–100.00)

## 2023-06-07 LAB — TSH: TSH: 0.77 u[IU]/mL (ref 0.35–5.50)

## 2023-06-11 ENCOUNTER — Telehealth: Payer: Self-pay

## 2023-06-11 NOTE — Telephone Encounter (Signed)
 Patient came into office to speak with someone regarding the results, she was concerned because she thought she heard that she was pre-diabetic

## 2023-06-11 NOTE — Telephone Encounter (Signed)
 Copied from CRM [id number]. Topic: Clinical - Lab/Test Results >> Jun 11, 2023  3:26 PM Deaijah H wrote: Reason for CRM: Patients mom called in to ask if patient could be contacted after 4 regarding results due to not being able to hear while at work.

## 2023-06-15 ENCOUNTER — Encounter (HOSPITAL_COMMUNITY): Payer: Self-pay | Admitting: Emergency Medicine

## 2023-06-15 ENCOUNTER — Ambulatory Visit (HOSPITAL_COMMUNITY): Admission: EM | Admit: 2023-06-15 | Discharge: 2023-06-15 | Disposition: A | Discharge status: Home / Self Care

## 2023-06-15 DIAGNOSIS — G44201 Tension-type headache, unspecified, intractable: Secondary | ICD-10-CM | POA: Diagnosis not present

## 2023-06-15 MED ORDER — MELOXICAM 7.5 MG PO TABS: 7.5000 mg | ORAL_TABLET | Freq: Every day | ORAL | 0 refills | Status: DC

## 2023-06-15 MED ORDER — BACLOFEN 20 MG PO TABS: 20.0000 mg | ORAL_TABLET | Freq: Three times a day (TID) | ORAL | 0 refills | Status: DC

## 2023-06-15 NOTE — ED Provider Notes (Signed)
 [facility]  Note:  This document was prepared using Dragon voice recognition software and may include unintentional dictation errors.  MRN: [id number] DOB: 1980-10-15  Subjective:   Alicia Wells is a 43 y.o. female presenting for intractable headache and bilateral shoulder tension x 2 days.  Patient reports taking ibuprofen 800 mg yesterday which seemed to help with pain slightly but patient is still having headache and muscle tension.  Patient denies any head injury or trauma.  Patient denies any past history of migraine syndrome or chronic headaches.  No nasal congestion, cough, body aches, fatigue, chest pain, shortness of breath, weakness, dizziness.  No current facility-administered medications for this encounter.  Current Outpatient Medications:    baclofen (LIORESAL) 20 MG tablet, Take 1 tablet (20 mg total) by mouth 3 (three) times daily., Disp: 30 each, Rfl: 0   meloxicam (MOBIC) 7.5 MG tablet, Take 1 tablet (7.5 mg total) by mouth daily., Disp: 30 tablet, Rfl: 0   cetirizine (ZYRTEC) 10 MG tablet, Take 1 tablet (10 mg total) by mouth daily., Disp: 30 tablet, Rfl: 2   Cholecalciferol (VITAMIN D-3) 125 MCG (5000 UT) TABS, Take 1 tablet by mouth daily., Disp: , Rfl:    ibuprofen (ADVIL) 800 MG tablet, Take 1 tablet (800 mg total) by mouth every 8 (eight) hours as needed for moderate pain (pain score 4-6)., Disp: 45 tablet, Rfl: 1   Multiple Vitamin (MULTIVITAMIN WITH MINERALS) TABS tablet, Take 1 tablet by mouth daily., Disp: , Rfl:    omeprazole (PRILOSEC) 20 MG capsule, TAKE 1 CAPSULE BY MOUTH EVERY DAY (Patient taking differently: as needed. TAKE 1 CAPSULE BY MOUTH EVERY DAY), Disp: 30 capsule, Rfl: 0   Allergies  Allergen Reactions   Penicillins Anaphylaxis    Rash, hives Has patient had a PCN reaction causing immediate rash, facial/tongue/throat swelling, SOB or lightheadedness with hypotension:  yes Has patient had a PCN reaction causing severe rash  involving mucus membranes or skin necrosis:Unknown Has patient had a PCN reaction that required hospitalization: no Has patient had a PCN reaction occurring within the last 10 years: no If all of the above answers are "NO", then may proceed with Cephalosporin use.    Ethanol-Alcohol [Ethanol] Hives and Swelling    If patient drinks alcohol she has swelling in her lips and bumps on her arms.    Past Medical History:  Diagnosis Date   Anemia    from heavy menstrual bleeding / pt is taking daily iron supplements   Anxiety    resolved per pt   Asthma    mild intermittent, pt states she quit smoking and no longer needs inhaler   Blood transfusion without reported diagnosis    Depression    resolved per pt   GERD (gastroesophageal reflux disease)    takes Prilosec as needed   History of esophagogastroduodenoscopy (EGD) 02/01/2015   Uterine leiomyoma    Vitamin D deficiency      Past Surgical History:  Procedure Laterality Date   CYSTOSCOPY N/A 04/27/2022   Procedure: CYSTOSCOPY;  Surgeon: Sherian Rein, MD;  Location: [facility];  Service: Gynecology;  Laterality: N/A;   ROBOTIC ASSISTED LAPAROSCOPIC HYSTERECTOMY AND SALPINGECTOMY Bilateral 04/27/2022   Procedure: XI ROBOTIC ASSISTED LAPAROSCOPIC HYSTERECTOMY AND SALPINGECTOMY/MINI LAPAROTOMY;  Surgeon: Sherian Rein, MD;  Location: [facility];  Service: Gynecology;  Laterality: Bilateral;   VAGINAL HYSTERECTOMY  2024   Partial   WISDOM TOOTH EXTRACTION      Family History  Problem  Relation Age of Onset   Diabetes Father    Asthma Maternal Uncle    Asthma Maternal Grandmother    Pancreatic disease Neg Hx    Colon cancer Neg Hx     Social History   Tobacco Use   Smoking status: Former    Current packs/day: 0.00    Average packs/day: 0.3 packs/day for 15.2 years (3.8 ttl pk-yrs)    Types: Cigarettes    Start date: 01/12/2002    Quit date: 2019    Years since quitting: 6.2    Smokeless tobacco: Never  Vaping Use   Vaping status: Never Used  Substance Use Topics   Alcohol use: No    Alcohol/week: 0.0 standard drinks of alcohol   Drug use: No    ROS Refer to HPI for ROS details.  Objective:   Vitals: BP 112/77 (BP Location: Left Arm)   Pulse 82   Temp 98.6 F (37 C) (Oral)   Resp 14   LMP 04/08/2022 (Exact Date)   SpO2 97%   Physical Exam Vitals and nursing note reviewed.  Constitutional:      General: She is not in acute distress.    Appearance: She is well-developed. She is not ill-appearing or toxic-appearing.  HENT:     Head: Normocephalic.     Mouth/Throat:     Mouth: Mucous membranes are moist.     Pharynx: Oropharynx is clear.  Eyes:     General: No visual field deficit.    Extraocular Movements: Extraocular movements intact.     Conjunctiva/sclera: Conjunctivae normal.     Pupils: Pupils are equal, round, and reactive to light.  Cardiovascular:     Rate and Rhythm: Normal rate.  Pulmonary:     Effort: Pulmonary effort is normal. No respiratory distress.  Musculoskeletal:     Cervical back: Normal range of motion and neck supple. No rigidity.  Lymphadenopathy:     Cervical: No cervical adenopathy.  Skin:    General: Skin is warm and dry.     Capillary Refill: Capillary refill takes less than 2 seconds.  Neurological:     Mental Status: She is alert and oriented to person, place, and time.     Cranial Nerves: No cranial nerve deficit or facial asymmetry.     Sensory: No sensory deficit.     Motor: No weakness.  Psychiatric:        Mood and Affect: Mood normal.        Behavior: Behavior normal.     Procedures  No results found for this or any previous visit (from the past 24 hours).  Assessment and Plan :   PDMP not reviewed this encounter.  1. Acute intractable tension-type headache    1. Acute intractable tension-type headache (Primary) - baclofen (LIORESAL) 20 MG tablet; Take 1 tablet (20 mg total) by mouth 3  (three) times daily.  Dispense: 30 each; Refill: 0 - meloxicam (MOBIC) 7.5 MG tablet; Take 1 tablet (7.5 mg total) by mouth daily.  Dispense: 30 tablet; Refill: 0 -Take medications as directed and continue to monitor symptoms for any changes severity if there is any escalation of current symptoms or development of new symptoms follow-up for further evaluation and management.  Lucky Cowboy   [place], [place], [place] 06/15/23 [phone]

## 2023-06-15 NOTE — Discharge Instructions (Addendum)
 1. Acute intractable tension-type headache (Primary) - baclofen (LIORESAL) 20 MG tablet; Take 1 tablet (20 mg total) by mouth 3 (three) times daily.  Dispense: 30 each; Refill: 0 - meloxicam (MOBIC) 7.5 MG tablet; Take 1 tablet (7.5 mg total) by mouth daily.  Dispense: 30 tablet; Refill: 0 -Take medications as directed and continue to monitor symptoms for any changes severity if there is any escalation of current symptoms or development of new symptoms follow-up for further evaluation and management.

## 2023-06-15 NOTE — ED Triage Notes (Signed)
 Pt c/o having intermittent headaches "for a while". Yesterday frontal headache was worse. Took Ibuprofen 800 mg "and took a while for it to help": denies nausea, visual impairment. Went to doctor last week and reports that she was pre-diabetic.

## 2023-07-05 ENCOUNTER — Encounter: Payer: Self-pay | Admitting: Family Medicine

## 2023-07-05 ENCOUNTER — Ambulatory Visit (INDEPENDENT_AMBULATORY_CARE_PROVIDER_SITE_OTHER): Admitting: Family Medicine

## 2023-07-05 DIAGNOSIS — R519 Headache, unspecified: Secondary | ICD-10-CM | POA: Diagnosis not present

## 2023-07-05 DIAGNOSIS — J302 Other seasonal allergic rhinitis: Secondary | ICD-10-CM

## 2023-07-05 MED ORDER — CETIRIZINE HCL 10 MG PO TABS: 10.0000 mg | ORAL_TABLET | Freq: Every day | ORAL | 2 refills | Status: AC

## 2023-07-05 MED ORDER — IBUPROFEN 800 MG PO TABS: 800.0000 mg | ORAL_TABLET | Freq: Three times a day (TID) | ORAL | 1 refills | Status: DC | PRN

## 2023-07-05 NOTE — Progress Notes (Signed)
 Established Patient Office Visit   Subjective:  Patient ID: Alicia Wells, female    DOB: 1980-10-11  Age: 43 y.o. MRN: [id number]  Chief Complaint  Patient presents with   Medical Management of Chronic Issues   Headache    HPI Patient is following up on headaches. 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 She reports the headaches are mostly frontal, daily. Also, eyes will become red.   She reports her headaches have improved to about 2 times a week from daily headaches. Patient reports the construction at her job has moved away from where is located which has helped. She currently taking Zyrtec daily and Ibuprofen as needed.   ROS See HPI above     Objective:   BP 120/72   Pulse 79   Temp 97.9 F (36.6 C) (Oral)   Ht 5\' 6"  (1.676 m)   Wt 161 lb (73 kg)   LMP 04/08/2022 (Exact Date)   SpO2 98%   BMI 25.99 kg/m    Physical Exam Vitals reviewed.  Constitutional:      General: She is not in acute distress.    Appearance: Normal appearance. She is not ill-appearing, toxic-appearing or diaphoretic.  Eyes:     General:        Right eye: No discharge.        Left eye: No discharge.     Conjunctiva/sclera: Conjunctivae normal.  Cardiovascular:     Rate and Rhythm: Normal rate and regular rhythm.  Pulmonary:     Effort: Pulmonary effort is normal. No respiratory distress.  Musculoskeletal:        General: Normal range of motion.  Skin:    General: Skin is warm and dry.  Neurological:     General: No focal deficit present.     Mental Status: She is alert and oriented to person, place, and time. Mental status is at baseline.  Psychiatric:        Mood and Affect: Mood normal.        Behavior: Behavior normal.        Thought Content: Thought content normal.        Judgment: Judgment normal.      Assessment & Plan:  Nonintractable episodic  headache, unspecified headache type -     Cetirizine HCl; Take 1 tablet (10 mg total) by mouth daily.  Dispense: 30 tablet; Refill: 2 -     Ibuprofen; Take 1 tablet (800 mg total) by mouth every 8 (eight) hours as needed for moderate pain (pain score 4-6).  Dispense: 45 tablet; Refill: 1  Seasonal allergies -     Cetirizine HCl; Take 1 tablet (10 mg total) by mouth daily.  Dispense: 30 tablet; Refill: 2  -Recommend to continue taking Zyrtec daily for seasonal allergies that are causing headaches. If allergies become very severe, may add another antihistamine, such as Claritin in the morning for about a month. Recommend to alternate allergy medication about every 3 months. Refilled Zyrtec.  -Refilled Ibuprofen for headaches. Offered a referral to neurology, but declined.  -If headaches become more severe or become worse, follow up.   -Discussed about labs from previous visit. A1c is prediabetic. Recommend to decrease carbohydrates and sweets. Participate in regular exercise.  -Follow up in 3 months. Will recheck A1c then.   Return in about 3 months (around 10/04/2023) for follow-up.   Zandra Abts, NP

## 2023-07-05 NOTE — Patient Instructions (Addendum)
-  Recommend to continue taking Zyrtec daily for seasonal allergies that are causing headaches. If allergies become very severe, may add another antihistamine, such as Claritin in the morning for about a month. Recommend to alternate allergy medication about every 3 months. Refilled Zyrtec.  -Refilled Ibuprofen for headaches.  -If headaches become more severe or become worse, follow up.   -Discussed about labs from previous visit. A1c is prediabetic. Recommend to decrease carbohydrates and sweets. Participate in regular exercise.  -Follow up in 3 months. Will recheck A1c then.

## 2023-07-26 ENCOUNTER — Ambulatory Visit
Admission: RE | Admit: 2023-07-26 | Discharge: 2023-07-26 | Disposition: A | Discharge status: Home / Self Care | Payer: 59 | Source: Ambulatory Visit | Attending: Obstetrics and Gynecology | Admitting: Obstetrics and Gynecology

## 2023-07-26 DIAGNOSIS — N6489 Other specified disorders of breast: Secondary | ICD-10-CM

## 2023-07-26 DIAGNOSIS — R928 Other abnormal and inconclusive findings on diagnostic imaging of breast: Secondary | ICD-10-CM

## 2023-08-02 ENCOUNTER — Other Ambulatory Visit: Payer: Self-pay | Admitting: Obstetrics and Gynecology

## 2023-08-02 DIAGNOSIS — N6489 Other specified disorders of breast: Secondary | ICD-10-CM

## 2023-10-04 ENCOUNTER — Ambulatory Visit: Admitting: Family Medicine

## 2023-10-28 ENCOUNTER — Ambulatory Visit: Payer: Self-pay | Admitting: Family Medicine

## 2023-10-28 ENCOUNTER — Ambulatory Visit (INDEPENDENT_AMBULATORY_CARE_PROVIDER_SITE_OTHER): Admitting: Family Medicine

## 2023-10-28 ENCOUNTER — Encounter: Payer: Self-pay | Admitting: Family Medicine

## 2023-10-28 VITALS — BP 116/82 | HR 73 | Temp 97.9°F | Ht 66.0 in | Wt 174.0 lb

## 2023-10-28 DIAGNOSIS — E559 Vitamin D deficiency, unspecified: Secondary | ICD-10-CM | POA: Diagnosis not present

## 2023-10-28 DIAGNOSIS — Z862 Personal history of diseases of the blood and blood-forming organs and certain disorders involving the immune mechanism: Secondary | ICD-10-CM

## 2023-10-28 DIAGNOSIS — R5383 Other fatigue: Secondary | ICD-10-CM | POA: Diagnosis not present

## 2023-10-28 DIAGNOSIS — G4700 Insomnia, unspecified: Secondary | ICD-10-CM

## 2023-10-28 LAB — TSH: TSH: 1 u[IU]/mL (ref 0.35–5.50)

## 2023-10-28 LAB — COMPREHENSIVE METABOLIC PANEL WITH GFR
ALT: 8 U/L (ref 0–35)
AST: 16 U/L (ref 0–37)
Albumin: 3.9 g/dL (ref 3.5–5.2)
Alkaline Phosphatase: 119 U/L — ABNORMAL HIGH (ref 39–117)
BUN: 11 mg/dL (ref 6–23)
CO2: 26 meq/L (ref 19–32)
Calcium: 9.3 mg/dL (ref 8.4–10.5)
Chloride: 103 meq/L (ref 96–112)
Creatinine, Ser: 0.79 mg/dL (ref 0.40–1.20)
GFR: 90.65 mL/min (ref >60.00)
Glucose, Bld: 88 mg/dL (ref 70–99)
Potassium: 4.7 meq/L (ref 3.5–5.1)
Sodium: 140 meq/L (ref 135–145)
Total Bilirubin: 0.3 mg/dL (ref 0.2–1.2)
Total Protein: 7.4 g/dL (ref 6.0–8.3)

## 2023-10-28 LAB — VITAMIN D 25 HYDROXY (VIT D DEFICIENCY, FRACTURES): VITD: 65.65 ng/mL (ref 30.00–100.00)

## 2023-10-28 LAB — CBC WITH DIFFERENTIAL/PLATELET
Basophils Absolute: 0 K/uL (ref 0.0–0.1)
Basophils Relative: 0.6 % (ref 0.0–3.0)
Eosinophils Absolute: 0.2 K/uL (ref 0.0–0.7)
Eosinophils Relative: 2.9 % (ref 0.0–5.0)
HCT: 39.1 % (ref 36.0–46.0)
Hemoglobin: 12.9 g/dL (ref 12.0–15.0)
Lymphocytes Relative: 23.8 % (ref 12.0–46.0)
Lymphs Abs: 1.8 K/uL (ref 0.7–4.0)
MCHC: 32.9 g/dL (ref 30.0–36.0)
MCV: 85.2 fl (ref 78.0–100.0)
Monocytes Absolute: 0.3 K/uL (ref 0.1–1.0)
Monocytes Relative: 4.3 % (ref 3.0–12.0)
Neutro Abs: 5.2 K/uL (ref 1.4–7.7)
Neutrophils Relative %: 68.4 % (ref 43.0–77.0)
Platelets: 340 K/uL (ref 150.0–400.0)
RBC: 4.59 Mil/uL (ref 3.87–5.11)
RDW: 15.4 % (ref 11.5–15.5)
WBC: 7.7 K/uL (ref 4.0–10.5)

## 2023-10-28 LAB — VITAMIN B12: Vitamin B-12: 366 pg/mL (ref 211–911)

## 2023-10-28 NOTE — Progress Notes (Signed)
 Established Patient Office Visit   Subjective:  Patient ID: Alicia Wells, female    DOB: 12/04/1980  Age: 43 y.o. MRN: [id number]  Chief Complaint  Patient presents with   Fatigue    HPI Patient is complaining of fatigue. Started last month gradually. She reports she is getting 5-6 hours of sleep and still feeling tired through the day. Where she used to get the same amount of sleep and not feel fatigued. She reports she tosses and turns a lot during the night due to not finding a good position to sleep at night. She reports she has recently changed pillows and her mattress is not old.  She reports she had a history of anemia and vitamin D  deficiency. However, she is taking a vitamin D  supplement. She is concerned it could be her iron  levels or vitamin D  level.   Also, taking Benadryl  most nights to sleep. She reports her has tried Melatonin with no success. ROS See HPI above     Objective:     BP 116/82   Pulse 73   Temp 97.9 F (36.6 C) (Oral)   Ht 5' 6 (1.676 m)   Wt 174 lb (78.9 kg)   LMP 04/08/2022 (Exact Date)   SpO2 97%   BMI 28.08 kg/m    Physical Exam Vitals reviewed.  Constitutional:      General: She is not in acute distress.    Appearance: Normal appearance. She is overweight. She is not ill-appearing, toxic-appearing or diaphoretic.  HENT:     Head: Normocephalic and atraumatic.  Eyes:     General:        Right eye: No discharge.        Left eye: No discharge.     Conjunctiva/sclera: Conjunctivae normal.  Cardiovascular:     Rate and Rhythm: Normal rate and regular rhythm.     Heart sounds: Normal heart sounds. No murmur heard.    No friction rub. No gallop.  Pulmonary:     Effort: Pulmonary effort is normal. No respiratory distress.     Breath sounds: Normal breath sounds.  Musculoskeletal:        General: Normal range of motion.  Skin:    General: Skin is warm and dry.  Neurological:     General: No focal deficit present.      Mental Status: She is alert and oriented to person, place, and time. Mental status is at baseline.  Psychiatric:        Mood and Affect: Mood normal.        Behavior: Behavior normal.        Thought Content: Thought content normal.        Judgment: Judgment normal.      Assessment & Plan:  Fatigue, unspecified type -     CBC with Differential/Platelet -     Iron , TIBC and Ferritin Panel -     Comprehensive metabolic panel with GFR -     TSH -     VITAMIN D  25 Hydroxy (Vit-D Deficiency, Fractures) -     Vitamin B12  Vitamin D  deficiency -     VITAMIN D  25 Hydroxy (Vit-D Deficiency, Fractures)  History of anemia -     CBC with Differential/Platelet -     Iron , TIBC and Ferritin Panel  Insomnia, unspecified type  -Ordered labs (CBC, CMP, Iron  panel, TSH, Vitamin B12/D) for reasons of fatigue with a history of anemia and vitamin D  deficiency. Office will call with  results and will be available MyChart.  -Recommend to not take Benadryl  at night time and try taking Zyrtec  that you usually take at night time.  -Follow up in 2 weeks.   Return in about 2 weeks (around 11/11/2023) for follow-up.   Sanay Belmar, NP

## 2023-10-28 NOTE — Patient Instructions (Addendum)
-  It was good to see you today. -Ordered labs (CBC, CMP, Iron  panel, TSH, Vitamin B12/D) for reasons of fatigue with a history of anemia and vitamin D  deficiency. Office will call with results and will be available MyChart.  -Recommend to not take Benadryl  at night time and try taking Zyrtec  that you usually take at night time.  -Follow up in 2 weeks.

## 2023-10-29 LAB — IRON,TIBC AND FERRITIN PANEL
%SAT: 14 % — ABNORMAL LOW (ref 16–45)
Ferritin: 24 ng/mL (ref 16–232)
Iron: 41 ug/dL (ref 40–190)
TIBC: 300 ug/dL (ref 250–450)

## 2023-11-15 ENCOUNTER — Ambulatory Visit: Admitting: Family Medicine

## 2024-01-24 ENCOUNTER — Encounter

## 2024-02-02 ENCOUNTER — Encounter

## 2024-02-07 ENCOUNTER — Ambulatory Visit
Admission: RE | Admit: 2024-02-07 | Discharge: 2024-02-07 | Disposition: A | Source: Ambulatory Visit | Attending: Obstetrics and Gynecology | Admitting: Obstetrics and Gynecology

## 2024-02-07 DIAGNOSIS — N6489 Other specified disorders of breast: Secondary | ICD-10-CM

## 2024-02-24 ENCOUNTER — Encounter: Payer: Self-pay | Admitting: Family Medicine

## 2024-02-24 ENCOUNTER — Ambulatory Visit (INDEPENDENT_AMBULATORY_CARE_PROVIDER_SITE_OTHER): Admitting: Family Medicine

## 2024-02-24 VITALS — BP 110/72 | HR 80 | Temp 98.1°F | Ht 66.0 in | Wt 174.0 lb

## 2024-02-24 DIAGNOSIS — R42 Dizziness and giddiness: Secondary | ICD-10-CM

## 2024-02-24 DIAGNOSIS — E162 Hypoglycemia, unspecified: Secondary | ICD-10-CM | POA: Diagnosis not present

## 2024-02-24 DIAGNOSIS — R5383 Other fatigue: Secondary | ICD-10-CM

## 2024-02-24 DIAGNOSIS — E663 Overweight: Secondary | ICD-10-CM

## 2024-02-24 DIAGNOSIS — E559 Vitamin D deficiency, unspecified: Secondary | ICD-10-CM | POA: Diagnosis not present

## 2024-02-24 DIAGNOSIS — Z862 Personal history of diseases of the blood and blood-forming organs and certain disorders involving the immune mechanism: Secondary | ICD-10-CM

## 2024-02-24 DIAGNOSIS — J019 Acute sinusitis, unspecified: Secondary | ICD-10-CM

## 2024-02-24 MED ORDER — AZITHROMYCIN 250 MG PO TABS: ORAL_TABLET | ORAL | 0 refills | Status: AC

## 2024-02-24 NOTE — Progress Notes (Signed)
 Established Patient Office Visit   Subjective:  Patient ID: Alicia Wells, female    DOB: 1980/06/24  Age: 43 y.o. MRN: [id number]  Chief Complaint  Patient presents with   Blood Sugar Problem    HPI Patient reports while at work today her blood sugar dropped around 9:30am. She reports her blood sugar was 76. She reports she had some Pepsi, Nutty Butter, and orange juice. Blood sugar went back up to 126. She reports eating Oatmeal around 5:30am. She reports she last checked her blood sugar at 10:40am before leaving work with blood sugar remaining in the 120s. She reports she ate some pizza this afternoon.  She reports she is not having any symptoms at the moment. She reports the lightheadedness improved after eating those sugary items. Denies this ever happening before.   She reports she is still having some allergy  and sinus symptoms. She has been taking Ibuprofen .  ROS See HPI above     Objective:   BP 110/72   Pulse 80   Temp 98.1 F (36.7 C) (Oral)   Ht 5' 6 (1.676 m)   Wt 174 lb (78.9 kg)   LMP 04/08/2022 (Exact Date)   SpO2 98%   BMI 28.08 kg/m    Physical Exam Vitals reviewed.  Constitutional:      General: She is not in acute distress.    Appearance: Normal appearance. She is overweight. She is not ill-appearing, toxic-appearing or diaphoretic.  HENT:     Head: Normocephalic and atraumatic.     Right Ear: Tympanic membrane, ear canal and external ear normal. There is no impacted cerumen.     Left Ear: Tympanic membrane, ear canal and external ear normal. There is no impacted cerumen.     Nose:     Right Sinus: No maxillary sinus tenderness or frontal sinus tenderness.     Left Sinus: No maxillary sinus tenderness or frontal sinus tenderness.     Mouth/Throat:     Pharynx: Oropharynx is clear. Uvula midline. No pharyngeal swelling, oropharyngeal exudate, posterior oropharyngeal erythema or uvula swelling.  Eyes:     General:        Right eye: No  discharge.        Left eye: No discharge.     Conjunctiva/sclera: Conjunctivae normal.  Cardiovascular:     Rate and Rhythm: Normal rate and regular rhythm.     Heart sounds: Normal heart sounds. No murmur heard.    No friction rub. No gallop.  Pulmonary:     Effort: Pulmonary effort is normal. No respiratory distress.     Breath sounds: Normal breath sounds.  Musculoskeletal:        General: Normal range of motion.  Lymphadenopathy:     Head:     Right side of head: No submental or submandibular adenopathy.     Left side of head: No submental or submandibular adenopathy.  Skin:    General: Skin is warm and dry.  Neurological:     General: No focal deficit present.     Mental Status: She is alert and oriented to person, place, and time. Mental status is at baseline.  Psychiatric:        Mood and Affect: Mood normal.        Behavior: Behavior normal.        Thought Content: Thought content normal.        Judgment: Judgment normal.      Assessment & Plan:  Overweight (BMI 25.0-29.9) -  CBC with Differential/Platelet -     Comprehensive metabolic panel with GFR -     VITAMIN D  25 Hydroxy (Vit-D Deficiency, Fractures) -     Hemoglobin A1c -     TSH  Hypoglycemia -     Comprehensive metabolic panel with GFR -     Hemoglobin A1c  Episodic lightheadedness -     Magnesium  Vitamin D  deficiency -     VITAMIN D  25 Hydroxy (Vit-D Deficiency, Fractures)  History of anemia -     CBC with Differential/Platelet -     Iron , TIBC and Ferritin Panel  Fatigue, unspecified type -     CBC with Differential/Platelet -     Iron , TIBC and Ferritin Panel -     Comprehensive metabolic panel with GFR -     VITAMIN D  25 Hydroxy (Vit-D Deficiency, Fractures) -     TSH -     Vitamin B12 -     Magnesium  Acute non-recurrent sinusitis, unspecified location -     Azithromycin; Take 2 tablets on day 1, then 1 tablet daily on days 2 through 5  Dispense: 6 tablet; Refill: 0  -Blood  sugar was stable in office. Recommend to stay hydrated with water  64-100oz of water  day. Eat a small snack or light meal every 3-4 hours while awake.  -Prescribed Azithromycin 250mg  tablets for sinus infection. Take 2 tablets today, take 1 tablet 2nd through 5th day.  -Ordered labs for reason of symptoms. Office will call with results and will be available via MyChart. -Recommend to be seen at the emergency department if this occurs again if office is not open. -Follow up in if not improved or becomes worse.    Valery Amedee, NP

## 2024-02-24 NOTE — Patient Instructions (Addendum)
-  It was a pleasure to see you today.  -Blood sugar was stable in office. Recommend to stay hydrated with water  64-100oz of water  day. Eat a small snack or light meal every 3-4 hours while awake.  -Prescribed Azithromycin 250mg  tablets for sinus infection. Take 2 tablets today, take 1 tablet 2nd through 5th day.  -Ordered labs for reason of symptoms. Office will call with results and will be available via MyChart. -Recommend to be seen at the emergency department if this occurs again if office is not open. -Follow up in if not improved or becomes worse.

## 2024-02-25 ENCOUNTER — Ambulatory Visit

## 2024-02-25 ENCOUNTER — Ambulatory Visit: Payer: Self-pay | Admitting: Family Medicine

## 2024-02-25 DIAGNOSIS — R748 Abnormal levels of other serum enzymes: Secondary | ICD-10-CM

## 2024-02-25 DIAGNOSIS — D649 Anemia, unspecified: Secondary | ICD-10-CM

## 2024-02-25 LAB — COMPREHENSIVE METABOLIC PANEL WITH GFR
ALT: 11 U/L (ref 0–35)
AST: 23 U/L (ref 0–37)
Albumin: 4.5 g/dL (ref 3.5–5.2)
Alkaline Phosphatase: 140 U/L — ABNORMAL HIGH (ref 39–117)
BUN: 9 mg/dL (ref 6–23)
CO2: 28 meq/L (ref 19–32)
Calcium: 9.7 mg/dL (ref 8.4–10.5)
Chloride: 101 meq/L (ref 96–112)
Creatinine, Ser: 0.81 mg/dL (ref 0.40–1.20)
GFR: 87.77 mL/min (ref >60.00)
Glucose, Bld: 73 mg/dL (ref 70–99)
Potassium: 4.5 meq/L (ref 3.5–5.1)
Sodium: 137 meq/L (ref 135–145)
Total Bilirubin: 0.2 mg/dL (ref 0.2–1.2)
Total Protein: 8 g/dL (ref 6.0–8.3)

## 2024-02-25 LAB — CBC WITH DIFFERENTIAL/PLATELET
Basophils Absolute: 0.1 K/uL (ref 0.0–0.1)
Basophils Relative: 0.7 % (ref 0.0–3.0)
Eosinophils Absolute: 0.1 K/uL (ref 0.0–0.7)
Eosinophils Relative: 1 % (ref 0.0–5.0)
HCT: 40.1 % (ref 36.0–46.0)
Hemoglobin: 13.2 g/dL (ref 12.0–15.0)
Lymphocytes Relative: 17.8 % (ref 12.0–46.0)
Lymphs Abs: 2.2 K/uL (ref 0.7–4.0)
MCHC: 32.9 g/dL (ref 30.0–36.0)
MCV: 85.3 fl (ref 78.0–100.0)
Monocytes Absolute: 0.6 K/uL (ref 0.1–1.0)
Monocytes Relative: 4.7 % (ref 3.0–12.0)
Neutro Abs: 9.4 K/uL — ABNORMAL HIGH (ref 1.4–7.7)
Neutrophils Relative %: 75.8 % (ref 43.0–77.0)
Platelets: 349 K/uL (ref 150.0–400.0)
RBC: 4.7 Mil/uL (ref 3.87–5.11)
RDW: 14.9 % (ref 11.5–15.5)
WBC: 12.4 K/uL — ABNORMAL HIGH (ref 4.0–10.5)

## 2024-02-25 LAB — IRON,TIBC AND FERRITIN PANEL
%SAT: 8 % — ABNORMAL LOW (ref 16–45)
Ferritin: 29 ng/mL (ref 16–232)
Iron: 28 ug/dL — ABNORMAL LOW (ref 40–190)
TIBC: 348 ug/dL (ref 250–450)

## 2024-02-25 LAB — VITAMIN B12: Vitamin B-12: 367 pg/mL (ref 211–911)

## 2024-02-25 LAB — HEMOGLOBIN A1C: Hgb A1c MFr Bld: 5.5 % (ref 4.6–6.5)

## 2024-02-25 LAB — VITAMIN D 25 HYDROXY (VIT D DEFICIENCY, FRACTURES): VITD: 66.46 ng/mL (ref 30.00–100.00)

## 2024-02-25 LAB — MAGNESIUM: Magnesium: 2.3 mg/dL (ref 1.5–2.5)

## 2024-02-25 LAB — TSH: TSH: 1.33 u[IU]/mL (ref 0.35–5.50)

## 2024-02-25 LAB — GAMMA GT: GGT: 22 U/L (ref 7–51)

## 2024-02-28 ENCOUNTER — Ambulatory Visit: Payer: Self-pay | Admitting: Family Medicine

## 2024-02-28 MED ORDER — IRON (FERROUS SULFATE) 325 (65 FE) MG PO TABS: 325.0000 mg | ORAL_TABLET | Freq: Every day | ORAL | 0 refills | Status: AC

## 2024-02-28 NOTE — Addendum Note (Signed)
 Addended by: ELNER NANNY B on: 02/28/2024 02:20 PM   Modules accepted: Orders

## 2024-03-15 ENCOUNTER — Other Ambulatory Visit: Payer: Self-pay | Admitting: Family Medicine

## 2024-03-15 DIAGNOSIS — R519 Headache, unspecified: Secondary | ICD-10-CM

## 2024-03-19 ENCOUNTER — Ambulatory Visit (HOSPITAL_COMMUNITY): Admission: EM | Admit: 2024-03-19 | Discharge: 2024-03-19 | Disposition: A | Discharge status: Home / Self Care

## 2024-03-19 ENCOUNTER — Encounter (HOSPITAL_COMMUNITY): Payer: Self-pay

## 2024-03-19 DIAGNOSIS — R202 Paresthesia of skin: Secondary | ICD-10-CM | POA: Diagnosis not present

## 2024-03-19 NOTE — Discharge Instructions (Addendum)
 You were seen today for tingling and numbness on the right side of your face extending into the top of your left shoulder. Your exam today is reassuring, as your facial strength, symmetry, and overall neurologic function are normal. At this time, your symptoms do not appear to be caused by a stroke or other emergency condition, but they do need close monitoring.  For now, pay attention to your symptoms and note any changes. You may try gentle measures such as staying well hydrated, avoiding triggers that seem to worsen the tingling, and getting adequate rest. Over-the-counter pain relievers such as acetaminophen  may be used if you develop headache or discomfort. Avoid excessive caffeine or sugary drinks if these seem to make symptoms worse.  Contact your primary care provider tomorrow to schedule a follow-up appointment. Your provider may choose to monitor your symptoms closely or order outpatient imaging, such as a CT scan or MRI, if the tingling persists or changes. Go to the emergency department immediately if you develop new or worsening symptoms, including facial drooping, weakness or numbness in an arm or leg, difficulty speaking or understanding speech, vision changes, severe dizziness, trouble swallowing, or a sudden, severe headache described as the worst headache of your life.

## 2024-03-19 NOTE — ED Provider Notes (Signed)
 " [facility]    CSN: [id number] Arrival date & time: 03/19/24  1225      History   Chief Complaint Chief Complaint  Patient presents with   Tingling    HPI Alicia Wells is a 43 y.o. female.   Discussed the use of AI scribe software for clinical note transcription with the patient, who gave verbal consent to proceed.   The patient presents with a tingling sensation on the right side of her face that has been present for the past several days. She reports that symptoms began after starting Airborne vitamin supplements, which she had not previously used. The sensation involves the right side of the face and extends upward into the scalp and head and downward toward the top of the right shoulder. She describes the sensation as tingling with intermittent numbness. She notes that symptoms may worsen when drinking beverages such as ginger ale. She also reports tingling involving the scalp.  She reports a recent sinus headache that has since resolved. She denies difficulty swallowing, sore throat, dizziness, focal weakness including the left arm, visual changes, photophobia, nausea, vomiting, chest pain, shortness of breath, palpitations, lower extremity swelling, neck pain, rash, skin color changes, pruritus, tongue swelling, dental pain, or oral lesions. She states this has never occurred previously and denies a history of hypertension or diabetes. She reports using hair dye on the 23rd but notes she has used the same product in the past without adverse reactions. She took benadryl  and zyrtec  without any relief in symptoms.   Her past medical history is notable for vitamin D  deficiency, anemia, fatigue, and intermittent lightheadedness. She was evaluated by her primary care provider on 02/24/24, at which time laboratory studies demonstrated normal sodium, potassium, glucose, renal and liver function, vitamin B12, vitamin D , hemoglobin, hematocrit, and TSH, with an A1c of 5.5%.  Iron  level was low at 28, and white blood cell count was mildly elevated at 12.4 in the setting of acute sinusitis. She was treated with azithromycin  and started on iron  supplementation at that time.  The following sections of the patient's history were reviewed and updated as appropriate: allergies, current medications, past family history, past medical history, past social history, past surgical history, and problem list.     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   Low serum vitamin D  06/13/2015   Dyspnea 02/15/2015   GERD (gastroesophageal reflux disease) 12/03/2014   Asthma, mild intermittent 01/23/2013   Migraine headache 07/29/2012   Smoker 07/29/2012   History of depression 07/29/2012    Past Surgical History:  Procedure Laterality Date   CYSTOSCOPY N/A 04/27/2022   Procedure: CYSTOSCOPY;  Surgeon: Danielle Rom, MD;  Location: [facility];  Service: Gynecology;  Laterality: N/A;   ROBOTIC ASSISTED LAPAROSCOPIC HYSTERECTOMY AND SALPINGECTOMY Bilateral 04/27/2022   Procedure: XI ROBOTIC ASSISTED LAPAROSCOPIC HYSTERECTOMY  AND SALPINGECTOMY/MINI LAPAROTOMY;  Surgeon: Danielle Rom, MD;  Location: [facility];  Service: Gynecology;  Laterality: Bilateral;   VAGINAL HYSTERECTOMY  2024   Partial   WISDOM TOOTH EXTRACTION      OB History   No obstetric history on file.       Home Medications    Prior to Admission medications  Medication Sig Start Date End Date Taking? Authorizing Provider  cetirizine  (ZYRTEC ) 10 MG tablet Take 1 tablet (10 mg total) by mouth daily. 07/05/23 02/24/24  Billy Philippe SAUNDERS, NP  Cholecalciferol (VITAMIN D -3) 125 MCG (5000 UT) TABS Take 1 tablet by mouth daily.    [provider]  ibuprofen  (ADVIL ) 800 MG tablet Take 1 tablet (800 mg total) by mouth every 8 (eight) hours as needed for moderate pain (pain score 4-6). 07/05/23   Billy Philippe SAUNDERS, NP  Iron , Ferrous Sulfate , 325 (65 Fe) MG TABS Take 325 mg by mouth daily. 02/28/24   Williamson, Joanna R, NP  Multiple Vitamin (MULTIVITAMIN WITH MINERALS) TABS tablet Take 1 tablet by mouth daily.    [provider]  omeprazole  (PRILOSEC) 20 MG capsule TAKE 1 CAPSULE BY MOUTH EVERY DAY Patient taking differently: as needed. TAKE 1 CAPSULE BY MOUTH EVERY DAY 12/31/20   Joesph Shaver Scales, PA-C    Family History Family History  Problem Relation Age of Onset   Diabetes Father    Asthma Maternal Uncle    Asthma Maternal Grandmother    Pancreatic disease Neg Hx    Colon cancer Neg Hx     Social History Social History[1]   Allergies   Penicillins and Ethanol-alcohol [ethanol]   Review of Systems Review of Systems  HENT:  Negative for mouth sores, sore throat and trouble swallowing.        No tongue swelling    Eyes:  Negative for photophobia and visual disturbance.  Respiratory:  Negative for choking and shortness of breath.   Cardiovascular:  Negative for chest pain, palpitations and leg swelling.  Gastrointestinal:  Negative for nausea and vomiting.  Musculoskeletal:  Negative for neck pain and neck stiffness.  Skin:  Negative for color change and rash.       No itching    Neurological:  Positive for headaches. Negative for dizziness, weakness and numbness.       Tingling sensation noted within the right face that extends up into the head and  down to the upper shoulder.   All other systems reviewed and are negative.    Physical Exam Triage Vital Signs ED Triage Vitals  Encounter Vitals Group     BP 03/19/24 1400 112/76     Girls Systolic BP Percentile --      Girls Diastolic BP Percentile --      Boys Systolic BP Percentile --      Boys Diastolic BP Percentile --      Pulse Rate 03/19/24 1400 70     Resp 03/19/24 1400 16     Temp 03/19/24 1400 98.2 F (36.8 C)     Temp Source 03/19/24 1400 Oral     SpO2 03/19/24 1400 98 %     Weight --      Height --      Head Circumference --      Peak Flow --      Pain Score 03/19/24 1356 0     Pain Loc --      Pain Education --      Exclude from  Growth Chart --    No data found.  Updated Vital Signs BP 112/76 (BP Location: Right Arm)   Pulse 70   Temp 98.2 F (36.8 C) (Oral)   Resp 16   LMP 04/08/2022   SpO2 98%   Visual Acuity Right Eye Distance:   Left Eye Distance:   Bilateral Distance:    Right Eye Near:   Left Eye Near:    Bilateral Near:     Physical Exam Vitals reviewed.  Constitutional:      General: She is awake. She is not in acute distress.    Appearance: Normal appearance. She is well-developed and well-groomed. She is not ill-appearing, toxic-appearing or diaphoretic.  HENT:     Head: Normocephalic.     Right Ear: Hearing normal.     Left Ear: Hearing normal.     Nose: Nose normal.     Mouth/Throat:     Mouth: Mucous membranes are moist.     Pharynx: Oropharynx is clear. Uvula midline.  Eyes:     General: Vision grossly intact. Gaze aligned appropriately. No visual field deficit.    Extraocular Movements: Extraocular movements intact.     Right eye: Normal extraocular motion and no nystagmus.     Left eye: Normal extraocular motion and no nystagmus.     Conjunctiva/sclera: Conjunctivae normal.     Pupils: Pupils are equal, round, and reactive to light.     Visual Fields: Right eye visual fields normal and left eye visual fields normal.   Neck:     Trachea: Phonation normal.  Cardiovascular:     Rate and Rhythm: Normal rate and regular rhythm.     Pulses: Normal pulses.     Heart sounds: Normal heart sounds.  Pulmonary:     Effort: Pulmonary effort is normal.     Breath sounds: Normal breath sounds and air entry.  Musculoskeletal:        General: Normal range of motion.     Cervical back: Full passive range of motion without pain, normal range of motion and neck supple.     Right lower leg: No edema.     Left lower leg: No edema.     Comments: Strength, sensation, and range of motion are intact in both the upper and lower extremities, with no focal deficits noted.   Lymphadenopathy:     Cervical: No cervical adenopathy.  Skin:    General: Skin is warm and dry.     Findings: No rash.  Neurological:     General: No focal deficit present.     Mental Status: She is alert and oriented to person, place, and time.     GCS: GCS eye subscore is 4. GCS verbal subscore is 5. GCS motor subscore is 6.     Cranial Nerves: No cranial nerve deficit, dysarthria or facial asymmetry.     Sensory: Sensory deficit present.     Motor: Motor function is intact.     Coordination: Coordination is intact.     Gait: Gait is intact.     Comments: Decreased sensation is noted on the right side of the face compared to the left. Facial symmetry is intact with no facial droop. Cranial nerve VII function is preserved, with normal strength of facial muscles, including symmetric smile, eyebrow elevation, and eye closure. Speech is clear. No other focal neurologic deficits appreciated.  Psychiatric:        Speech: Speech normal.        Behavior: Behavior  is cooperative.      UC Treatments / Results  Labs (all labs ordered are listed, but only abnormal results are displayed) Labs Reviewed - No data to display  EKG   Radiology No results found.  Procedures Procedures (including critical care time)  Medications Ordered in  UC Medications - No data to display  Initial Impression / Assessment and Plan / UC Course  I have reviewed the triage vital signs and the nursing notes.  Pertinent labs & imaging results that were available during my care of the patient were reviewed by me and considered in my medical decision making (see chart for details).     The patient presents with two days of right-sided facial tingling and intermittent numbness extending to the scalp and upper shoulder without associated motor deficits. Neurologic examination demonstrates mild sensory asymmetry on the right face with intact cranial nerve function, normal facial symmetry, and preserved strength. There are no red flag symptoms such as vision changes, speech difficulty, focal weakness, or altered mental status. Blood pressure is normal. Recent laboratory studies from early December were reviewed and are largely unremarkable, with normal electrolytes, glucose, thyroid function, vitamin B12, and vitamin D , and only mild iron  deficiency noted. The differential diagnosis includes benign nerve irritation, migraine variant, early Bells palsy, or a viral process such as herpes zoster, though the absence of pain or rash makes shingles less likely. Acute cerebrovascular event was considered but is felt to be unlikely given the patients age, lack of vascular risk factors, stable symptom pattern, and normal motor examination. The temporal association with Airborne vitamin use is noted but is less consistent with an allergic reaction given the absence of typical allergic features and persistence of symptoms. At this time, watchful waiting with close monitoring is appropriate. The patient was advised to contact her primary care provider for follow-up, with consideration of outpatient imaging if symptoms persist or evolve. She was instructed to seek emergency care immediately for new or worsening neurologic symptoms, including facial droop, limb weakness or  numbness, speech or vision changes, severe or sudden headache, or any other concerning changes.  Today's evaluation has revealed no signs of a dangerous process. Discussed diagnosis with patient and/or guardian. Patient and/or guardian aware of their diagnosis, possible red flag symptoms to watch out for and need for close follow up. Patient and/or guardian understands verbal and written discharge instructions. Patient and/or guardian comfortable with plan and disposition.  Patient and/or guardian has a clear mental status at this time, good insight into illness (after discussion and teaching) and has clear judgment to make decisions regarding their care  Documentation was completed with the aid of voice recognition software. Transcription may contain typographical errors.  Final Clinical Impressions(s) / UC Diagnoses   Final diagnoses:  Tingling of right upper extremity and right side of face     Discharge Instructions      You were seen today for tingling and numbness on the right side of your face extending into the top of your left shoulder. Your exam today is reassuring, as your facial strength, symmetry, and overall neurologic function are normal. At this time, your symptoms do not appear to be caused by a stroke or other emergency condition, but they do need close monitoring.  For now, pay attention to your symptoms and note any changes. You may try gentle measures such as staying well hydrated, avoiding triggers that seem to worsen the tingling, and getting adequate rest. Over-the-counter pain relievers such as acetaminophen   may be used if you develop headache or discomfort. Avoid excessive caffeine or sugary drinks if these seem to make symptoms worse.  Contact your primary care provider tomorrow to schedule a follow-up appointment. Your provider may choose to monitor your symptoms closely or order outpatient imaging, such as a CT scan or MRI, if the tingling persists or changes. Go to  the emergency department immediately if you develop new or worsening symptoms, including facial drooping, weakness or numbness in an arm or leg, difficulty speaking or understanding speech, vision changes, severe dizziness, trouble swallowing, or a sudden, severe headache described as the worst headache of your life.     ED Prescriptions   None    PDMP not reviewed this encounter.     [1]  Social History Tobacco Use   Smoking status: Former    Current packs/day: 0.00    Average packs/day: 0.3 packs/day for 15.2 years (3.8 ttl pk-yrs)    Types: Cigarettes    Start date: 01/12/2002    Quit date: 2019    Years since quitting: 6.9   Smokeless tobacco: Never  Vaping Use   Vaping status: Never Used  Substance Use Topics   Alcohol use: No    Alcohol/week: 0.0 standard drinks of alcohol   Drug use: No     Iola Lukes, FNP 03/19/24 1502  "

## 2024-03-19 NOTE — ED Triage Notes (Signed)
 Patient here today with c/o tingling on the right side of her face X 2 days after taking Airborne vitamins. This was the first time her taking this. Patient denies other symptoms. She has taken Benadryl  and Zyrtec  with no relief.

## 2024-04-10 ENCOUNTER — Other Ambulatory Visit

## 2024-04-10 ENCOUNTER — Ambulatory Visit: Payer: Self-pay | Admitting: Family Medicine

## 2024-04-10 DIAGNOSIS — R748 Abnormal levels of other serum enzymes: Secondary | ICD-10-CM | POA: Diagnosis not present

## 2024-04-10 LAB — CBC WITH DIFFERENTIAL/PLATELET
Basophils Absolute: 0 K/uL (ref 0.0–0.1)
Basophils Relative: 0.5 % (ref 0.0–3.0)
Eosinophils Absolute: 0.2 K/uL (ref 0.0–0.7)
Eosinophils Relative: 2.5 % (ref 0.0–5.0)
HCT: 38.4 % (ref 36.0–46.0)
Hemoglobin: 12.7 g/dL (ref 12.0–15.0)
Lymphocytes Relative: 25.3 % (ref 12.0–46.0)
Lymphs Abs: 1.8 K/uL (ref 0.7–4.0)
MCHC: 33.2 g/dL (ref 30.0–36.0)
MCV: 84.9 fl (ref 78.0–100.0)
Monocytes Absolute: 0.3 K/uL (ref 0.1–1.0)
Monocytes Relative: 4.3 % (ref 3.0–12.0)
Neutro Abs: 4.9 K/uL (ref 1.4–7.7)
Neutrophils Relative %: 67.4 % (ref 43.0–77.0)
Platelets: 319 K/uL (ref 150.0–400.0)
RBC: 4.52 Mil/uL (ref 3.87–5.11)
RDW: 15.4 % (ref 11.5–15.5)
WBC: 7.3 K/uL (ref 4.0–10.5)

## 2024-04-11 ENCOUNTER — Other Ambulatory Visit
